# Patient Record
Sex: Female | Born: 1985 | Race: White | Hispanic: No | Smoking: Never smoker
Health system: Southern US, Community
[De-identification: ages and names within clinical notes are randomized; demographics above are authoritative.]

---

## 2018-08-25 DIAGNOSIS — E559 Vitamin D deficiency, unspecified: Secondary | ICD-10-CM | POA: Insufficient documentation

## 2018-09-15 ENCOUNTER — Ambulatory Visit: Payer: Self-pay | Admitting: Family Medicine

## 2019-11-01 DIAGNOSIS — R05 Cough: Secondary | ICD-10-CM | POA: Diagnosis not present

## 2019-11-01 DIAGNOSIS — Z20822 Contact with and (suspected) exposure to covid-19: Secondary | ICD-10-CM | POA: Diagnosis not present

## 2019-11-01 DIAGNOSIS — R0981 Nasal congestion: Secondary | ICD-10-CM | POA: Diagnosis not present

## 2019-11-15 ENCOUNTER — Telehealth: Payer: Self-pay | Admitting: Family

## 2019-11-15 DIAGNOSIS — R399 Unspecified symptoms and signs involving the genitourinary system: Secondary | ICD-10-CM

## 2019-11-15 MED ORDER — CEPHALEXIN 500 MG PO CAPS: 500.0000 mg | ORAL_CAPSULE | Freq: Two times a day (BID) | ORAL | 0 refills | Status: DC

## 2019-11-15 NOTE — Progress Notes (Signed)

## 2020-02-22 ENCOUNTER — Ambulatory Visit
Admission: EM | Admit: 2020-02-22 | Discharge: 2020-02-22 | Disposition: A | Discharge status: Home / Self Care | Payer: Self-pay | Attending: Emergency Medicine | Admitting: Emergency Medicine

## 2020-02-22 ENCOUNTER — Encounter: Payer: Self-pay | Admitting: Emergency Medicine

## 2020-02-22 DIAGNOSIS — J111 Influenza due to unidentified influenza virus with other respiratory manifestations: Secondary | ICD-10-CM

## 2020-02-22 MED ORDER — ACETAMINOPHEN 325 MG PO TABS
650.0000 mg | ORAL_TABLET | Freq: Once | ORAL | Status: AC
  Administered 2020-02-22: 650 mg via ORAL

## 2020-02-22 MED ORDER — IBUPROFEN 800 MG PO TABS: 800.0000 mg | ORAL_TABLET | Freq: Three times a day (TID) | ORAL | 0 refills | Status: DC

## 2020-02-22 NOTE — ED Triage Notes (Signed)
Cough, chills, body aches since last night. Neg at home covid test.

## 2020-02-22 NOTE — Discharge Instructions (Addendum)
Covid/flu test pending, monitor my chart for results Alternate Tylenol and ibuprofen every 4 hours for fevers body aches headaches Rest and fluids Over-the-counter cough and cold medicine to further relieve cough and congestion if needed Follow-up for any concerns

## 2020-02-22 NOTE — ED Provider Notes (Signed)
[facility]    CSN: [id number] Arrival date & time: 02/22/20  0943      History   Chief Complaint Chief Complaint  Patient presents with  . Cough    HPI Brianna Hobbs is a 35 y.o. female presenting today for evaluation of URI symptoms.  Reports cough chills and body aches since last night.  Did at home Covid test which was negative.  Mild associated sore throat or congestion.  Denies known Covid exposures.  HPI  History reviewed. No pertinent past medical history.  There are no problems to display for this patient.   History reviewed. No pertinent surgical history.  OB History   No obstetric history on file.      Home Medications    Prior to Admission medications   Medication Sig Start Date End Date Taking? Authorizing Provider  ibuprofen (ADVIL) 800 MG tablet Take 1 tablet (800 mg total) by mouth 3 (three) times daily. 02/22/20  Yes Naila Elizondo, Junius Creamer, PA-C    Family History Family History  Problem Relation Age of Onset  . Healthy Mother   . Healthy Father     Social History Social History   Tobacco Use  . Smoking status: Never Smoker  . Smokeless tobacco: Never Used  Substance Use Topics  . Alcohol use: Yes     Allergies   Bactrim [sulfamethoxazole-trimethoprim]   Review of Systems Review of Systems  Constitutional: Positive for fever. Negative for activity change, appetite change, chills and fatigue.  HENT: Positive for congestion, rhinorrhea, sinus pressure and sore throat. Negative for ear pain and trouble swallowing.   Eyes: Negative for discharge and redness.  Respiratory: Positive for cough. Negative for chest tightness and shortness of breath.   Cardiovascular: Negative for chest pain.  Gastrointestinal: Negative for abdominal pain, diarrhea, nausea and vomiting.  Musculoskeletal: Negative for myalgias.  Skin: Negative for rash.  Neurological: Negative for dizziness, light-headedness and headaches.     Physical  Exam Triage Vital Signs ED Triage Vitals  Enc Vitals Group     BP 02/22/20 1222 116/79     Pulse Rate 02/22/20 1222 97     Resp 02/22/20 1222 20     Temp 02/22/20 1222 (!) 101.9 F (38.8 C)     Temp Source 02/22/20 1222 Oral     SpO2 02/22/20 1222 97 %     Weight --      Height --      Head Circumference --      Peak Flow --      Pain Score 02/22/20 1220 5     Pain Loc --      Pain Edu? --      Excl. in GC? --    No data found.  Updated Vital Signs BP 116/79 (BP Location: Left Arm)   Pulse 97   Temp (!) 101.9 F (38.8 C) (Oral)   Resp 20   LMP 02/17/2020   SpO2 97%   Visual Acuity Right Eye Distance:   Left Eye Distance:   Bilateral Distance:    Right Eye Near:   Left Eye Near:    Bilateral Near:     Physical Exam Vitals and nursing note reviewed.  Constitutional:      Appearance: She is well-developed and well-nourished.     Comments: No acute distress  HENT:     Head: Normocephalic and atraumatic.     Ears:     Comments: Bilateral ears without tenderness to palpation of external auricle,  tragus and mastoid, EAC's without erythema or swelling, TM's with good bony landmarks and cone of light. Non erythematous.     Nose: Nose normal.     Mouth/Throat:     Comments: Oral mucosa pink and moist, no tonsillar enlargement or exudate. Posterior pharynx patent and nonerythematous, no uvula deviation or swelling. Normal phonation. Eyes:     Conjunctiva/sclera: Conjunctivae normal.  Cardiovascular:     Rate and Rhythm: Normal rate.  Pulmonary:     Effort: Pulmonary effort is normal. No respiratory distress.     Comments: Breathing comfortably at rest, CTABL, no wheezing, rales or other adventitious sounds auscultated Abdominal:     General: There is no distension.  Musculoskeletal:        General: Normal range of motion.     Cervical back: Neck supple.  Skin:    General: Skin is warm and dry.  Neurological:     Mental Status: She is alert and oriented to  person, place, and time.  Psychiatric:        Mood and Affect: Mood and affect normal.      UC Treatments / Results  Labs (all labs ordered are listed, but only abnormal results are displayed) Labs Reviewed  COVID-19, FLU A+B NAA    EKG   Radiology No results found.  Procedures Procedures (including critical care time)  Medications Ordered in UC Medications  acetaminophen (TYLENOL) tablet 650 mg (650 mg Oral Given 02/22/20 1239)    Initial Impression / Assessment and Plan / UC Course  I have reviewed the triage vital signs and the nursing notes.  Pertinent labs & imaging results that were available during my care of the patient were reviewed by me and considered in my medical decision making (see chart for details).     Covid/flu test pending-suspect viral etiology, exam reassuring.  Symptomatic and supportive care rest and fluids.  Discussed strict return precautions. Patient verbalized understanding and is agreeable with plan.  Final Clinical Impressions(s) / UC Diagnoses   Final diagnoses:  Influenza-like illness     Discharge Instructions     Covid/flu test pending, monitor my chart for results Alternate Tylenol and ibuprofen every 4 hours for fevers body aches headaches Rest and fluids Over-the-counter cough and cold medicine to further relieve cough and congestion if needed Follow-up for any concerns    ED Prescriptions    Medication Sig Dispense Auth. Provider   ibuprofen (ADVIL) 800 MG tablet Take 1 tablet (800 mg total) by mouth 3 (three) times daily. 21 tablet Eun Vermeer, [place] C, PA-C     PDMP not reviewed this encounter.   Lew Dawes, PA-C 02/22/20 1259

## 2020-02-22 NOTE — ED Notes (Signed)
No answer, message left

## 2020-02-23 ENCOUNTER — Ambulatory Visit: Payer: Self-pay

## 2020-02-24 LAB — COVID-19, FLU A+B NAA
Influenza A, NAA: NOT DETECTED
Influenza B, NAA: NOT DETECTED
SARS-CoV-2, NAA: DETECTED — AB

## 2020-04-24 ENCOUNTER — Telehealth (INDEPENDENT_AMBULATORY_CARE_PROVIDER_SITE_OTHER): Payer: BC Managed Care – PPO | Admitting: Primary Care

## 2020-04-24 DIAGNOSIS — U071 COVID-19: Secondary | ICD-10-CM

## 2020-04-24 DIAGNOSIS — Z7689 Persons encountering health services in other specified circumstances: Secondary | ICD-10-CM | POA: Diagnosis not present

## 2020-04-24 NOTE — Progress Notes (Signed)
Virtual Visit via Telephone Note  I connected with Brianna Hobbs on 04/24/20 at  4:10 PM EST by telephone and verified that I am speaking with the correct person using two identifiers.  Location: Patient: car  Provider: Kerin Perna @[facility]     I discussed the limitations, risks, security and privacy concerns of performing an evaluation and management service by telephone and the availability of in person appointments. I also discussed with the patient that there may be a patient responsible charge related to this service. The patient expressed understanding and agreed to proceed.   History of Present Illness: Ms. Brianna Hobbs is a 35 year old female was dx COVID 02/22/20 and was schedule for a f/u . She was asymptomatic and doing well. She does have chest pain on her left breast goes away with in minutes.  Headaches when a strong smell is near. Father has HTN. Establishing care.   Observations/Objective: There were no vitals taken for this visit. Pertinent positive and negative noted in HPI.  Assessment and Plan: Diagnoses and all orders for this visit:  Encounter to establish care Establish care with new Provider. Asked to schedule in person appt for Bp check and labs   [facility] ED f/u resolved no s/s with infection     Follow Up Instructions:    I discussed the assessment and treatment plan with the patient. The patient was provided an opportunity to ask questions and all were answered. The patient agreed with the plan and demonstrated an understanding of the instructions.   The patient was advised to call back or seek an in-person evaluation if the symptoms worsen or if the condition fails to improve as anticipated.  I provided 10  minutes of non-face-to-face time during this encounter.   Kerin Perna, NP

## 2020-05-03 ENCOUNTER — Ambulatory Visit (INDEPENDENT_AMBULATORY_CARE_PROVIDER_SITE_OTHER): Payer: BC Managed Care – PPO | Admitting: Primary Care

## 2020-05-04 ENCOUNTER — Ambulatory Visit (INDEPENDENT_AMBULATORY_CARE_PROVIDER_SITE_OTHER): Payer: BC Managed Care – PPO | Admitting: Primary Care

## 2020-05-04 ENCOUNTER — Other Ambulatory Visit: Payer: Self-pay

## 2020-05-04 ENCOUNTER — Encounter (INDEPENDENT_AMBULATORY_CARE_PROVIDER_SITE_OTHER): Payer: Self-pay | Admitting: Primary Care

## 2020-05-04 VITALS — BP 114/77 | HR 75 | Temp 97.5°F | Ht 62.0 in | Wt 126.6 lb

## 2020-05-04 DIAGNOSIS — R5383 Other fatigue: Secondary | ICD-10-CM

## 2020-05-04 DIAGNOSIS — N926 Irregular menstruation, unspecified: Secondary | ICD-10-CM | POA: Diagnosis not present

## 2020-05-04 DIAGNOSIS — R7989 Other specified abnormal findings of blood chemistry: Secondary | ICD-10-CM | POA: Diagnosis not present

## 2020-05-04 NOTE — Progress Notes (Signed)
Established Patient Office Visit  Subjective:  Patient ID: Brianna Hobbs, female    DOB: April 01, 1985  Age: 35 y.o. MRN: [id number]  CC:  Chief Complaint  Patient presents with  . Blood Pressure Check    HPI Ms. Brianna Hobbs is a 35 year old female who presents for annual visit . History reviewed. No pertinent past medical history.  History reviewed. No pertinent surgical history.  Family History  Problem Relation Age of Onset  . Healthy Mother   . Healthy Father     Social History   Socioeconomic History  . Marital status: Divorced    Spouse name: Not on file  . Number of children: Not on file  . Years of education: Not on file  . Highest education level: Not on file  Occupational History  . Not on file  Tobacco Use  . Smoking status: Never Smoker  . Smokeless tobacco: Never Used  Substance and Sexual Activity  . Alcohol use: Yes  . Drug use: Not on file  . Sexual activity: Not on file  Other Topics Concern  . Not on file  Social History Narrative  . Not on file   Social Determinants of Health   Financial Resource Strain: Not on file  Food Insecurity: Not on file  Transportation Needs: Not on file  Physical Activity: Not on file  Stress: Not on file  Social Connections: Not on file  Intimate Partner Violence: Not on file    No outpatient medications prior to visit.   No facility-administered medications prior to visit.    Allergies  Allergen Reactions  . Bactrim [Sulfamethoxazole-Trimethoprim]     ROS Review of Systems  Constitutional: Positive for fatigue.  All other systems reviewed and are negative.     Objective:    Physical Exam Vitals reviewed.  Constitutional:      Appearance: Normal appearance. She is normal weight.  HENT:     Head: Normocephalic.     Right Ear: Tympanic membrane and external ear normal.     Left Ear: Tympanic membrane and external ear normal.     Nose: Nose normal.  Eyes:     Extraocular  Movements: Extraocular movements intact.     Pupils: Pupils are equal, round, and reactive to light.  Cardiovascular:     Rate and Rhythm: Normal rate and regular rhythm.  Pulmonary:     Effort: Pulmonary effort is normal.     Breath sounds: Normal breath sounds.  Abdominal:     General: Abdomen is flat. Bowel sounds are normal.     Palpations: Abdomen is soft.  Musculoskeletal:        General: Normal range of motion.     Cervical back: Normal range of motion and neck supple.  Skin:    General: Skin is warm and dry.  Neurological:     Mental Status: She is alert and oriented to person, place, and time.  Psychiatric:        Mood and Affect: Mood normal.        Behavior: Behavior normal.        Thought Content: Thought content normal.        Judgment: Judgment normal.     BP 114/77 (BP Location: Right Arm, Patient Position: Sitting, Cuff Size: Normal)   Pulse 75   Temp (!) 97.5 F (36.4 C) (Temporal)   Ht '5\' 2"'  (1.575 m)   Wt 126 lb 9.6 oz (57.4 kg)   LMP 04/17/2020 (Exact Date)  SpO2 98%   BMI 23.16 kg/m  Wt Readings from Last 3 Encounters:  05/04/20 126 lb 9.6 oz (57.4 kg)     Health Maintenance Due  Topic Date Due  . HIV Screening  Never done  . PAP SMEAR-Modifier  Never done    There are no preventive care reminders to display for this patient.  No results found for: TSH No results found for: WBC, HGB, HCT, MCV, PLT No results found for: NA, K, CHLORIDE, CO2, GLUCOSE, BUN, CREATININE, BILITOT, ALKPHOS, AST, ALT, PROT, ALBUMIN, CALCIUM, ANIONGAP, EGFR, GFR No results found for: CHOL No results found for: HDL No results found for: LDLCALC No results found for: TRIG No results found for: CHOLHDL No results found for: HGBA1C    Assessment & Plan:  [name] was seen today for blood pressure check.  Diagnoses and all orders for this visit:  Low vitamin D level Hx of which can also cause fatigue. -     Vitamin D, 25-hydroxy  Fatigue, unspecified  type Rule out causative factors  -     CBC with Differential -     TSH + free T4 -     Vitamin D, 25-hydroxy    Follow-up: Return if symptoms worsen or fail to improve, for chedule Pap with GYN.    Kerin Perna, NP

## 2020-05-04 NOTE — Patient Instructions (Signed)
Health Maintenance, Female Adopting a healthy lifestyle and getting preventive care are important in promoting health and wellness. Ask your health care provider about:  The right schedule for you to have regular tests and exams.  Things you can do on your own to prevent diseases and keep yourself healthy. What should I know about diet, weight, and exercise? Eat a healthy diet  Eat a diet that includes plenty of vegetables, fruits, low-fat dairy products, and lean protein.  Do not eat a lot of foods that are high in solid fats, added sugars, or sodium.   Maintain a healthy weight Body mass index (BMI) is used to identify weight problems. It estimates body fat based on height and weight. Your health care provider can help determine your BMI and help you achieve or maintain a healthy weight. Get regular exercise Get regular exercise. This is one of the most important things you can do for your health. Most adults should:  Exercise for at least 150 minutes each week. The exercise should increase your heart rate and make you sweat (moderate-intensity exercise).  Do strengthening exercises at least twice a week. This is in addition to the moderate-intensity exercise.  Spend less time sitting. Even light physical activity can be beneficial. Watch cholesterol and blood lipids Have your blood tested for lipids and cholesterol at 33 years of age, then have this test every 5 years. Have your cholesterol levels checked more often if:  Your lipid or cholesterol levels are high.  You are older than 33 years of age.  You are at high risk for heart disease. What should I know about cancer screening? Depending on your health history and family history, you may need to have cancer screening at various ages. This may include screening for:  Breast cancer.  Cervical cancer.  Colorectal cancer.  Skin cancer.  Lung cancer. What should I know about heart disease, diabetes, and high blood  pressure? Blood pressure and heart disease  High blood pressure causes heart disease and increases the risk of stroke. This is more likely to develop in people who have high blood pressure readings, are of African descent, or are overweight.  Have your blood pressure checked: ? Every 3-5 years if you are 18-39 years of age. ? Every year if you are 40 years old or older. Diabetes Have regular diabetes screenings. This checks your fasting blood sugar level. Have the screening done:  Once every three years after age 40 if you are at a normal weight and have a low risk for diabetes.  More often and at a younger age if you are overweight or have a high risk for diabetes. What should I know about preventing infection? Hepatitis B If you have a higher risk for hepatitis B, you should be screened for this virus. Talk with your health care provider to find out if you are at risk for hepatitis B infection. Hepatitis C Testing is recommended for:  Everyone born from 1945 through 1965.  Anyone with known risk factors for hepatitis C. Sexually transmitted infections (STIs)  Get screened for STIs, including gonorrhea and chlamydia, if: ? You are sexually active and are younger than 33 years of age. ? You are older than 33 years of age and your health care provider tells you that you are at risk for this type of infection. ? Your sexual activity has changed since you were last screened, and you are at increased risk for chlamydia or gonorrhea. Ask your health care provider   if you are at risk.  Ask your health care provider about whether you are at high risk for HIV. Your health care provider may recommend a prescription medicine to help prevent HIV infection. If you choose to take medicine to prevent HIV, you should first get tested for HIV. You should then be tested every 3 months for as long as you are taking the medicine. Pregnancy  If you are about to stop having your period (premenopausal) and  you may become pregnant, seek counseling before you get pregnant.  Take 400 to 800 micrograms (mcg) of folic acid every day if you become pregnant.  Ask for birth control (contraception) if you want to prevent pregnancy. Osteoporosis and menopause Osteoporosis is a disease in which the bones lose minerals and strength with aging. This can result in bone fractures. If you are 65 years old or older, or if you are at risk for osteoporosis and fractures, ask your health care provider if you should:  Be screened for bone loss.  Take a calcium or vitamin D supplement to lower your risk of fractures.  Be given hormone replacement therapy (HRT) to treat symptoms of menopause. Follow these instructions at home: Lifestyle  Do not use any products that contain nicotine or tobacco, such as cigarettes, e-cigarettes, and chewing tobacco. If you need help quitting, ask your health care provider.  Do not use street drugs.  Do not share needles.  Ask your health care provider for help if you need support or information about quitting drugs. Alcohol use  Do not drink alcohol if: ? Your health care provider tells you not to drink. ? You are pregnant, may be pregnant, or are planning to become pregnant.  If you drink alcohol: ? Limit how much you use to 0-1 drink a day. ? Limit intake if you are breastfeeding.  Be aware of how much alcohol is in your drink. In the [place] one drink equals one 12 oz bottle of beer (355 mL), one 5 oz glass of wine (148 mL), or one 1 oz glass of hard liquor (44 mL). General instructions  Schedule regular health, dental, and eye exams.  Stay current with your vaccines.  Tell your health care provider if: ? You often feel depressed. ? You have ever been abused or do not feel safe at home. Summary  Adopting a healthy lifestyle and getting preventive care are important in promoting health and wellness.  Follow your health care provider's instructions about healthy  diet, exercising, and getting tested or screened for diseases.  Follow your health care provider's instructions on monitoring your cholesterol and blood pressure. This information is not intended to replace advice given to you by your health care provider. Make sure you discuss any questions you have with your health care provider. Document Revised: 01/28/2018 Document Reviewed: 01/28/2018 Elsevier Patient Education  2021 Elsevier Inc.  

## 2020-05-05 LAB — CMP14+EGFR
ALT: 6 IU/L (ref 0–32)
AST: 16 IU/L (ref 0–40)
Albumin/Globulin Ratio: 2.1 (ref 1.2–2.2)
Albumin: 4.5 g/dL (ref 3.8–4.8)
Alkaline Phosphatase: 67 IU/L (ref 44–121)
BUN/Creatinine Ratio: 16 (ref 9–23)
BUN: 12 mg/dL (ref 6–20)
Bilirubin Total: 0.7 mg/dL (ref 0.0–1.2)
CO2: 24 mmol/L (ref 20–29)
Calcium: 9.5 mg/dL (ref 8.7–10.2)
Chloride: 103 mmol/L (ref 96–106)
Creatinine, Ser: 0.73 mg/dL (ref 0.57–1.00)
Globulin, Total: 2.1 g/dL (ref 1.5–4.5)
Glucose: 80 mg/dL (ref 65–99)
Potassium: 4.4 mmol/L (ref 3.5–5.2)
Sodium: 140 mmol/L (ref 134–144)
Total Protein: 6.6 g/dL (ref 6.0–8.5)
eGFR: 111 mL/min/{1.73_m2} (ref >59)

## 2020-05-05 LAB — CBC WITH DIFFERENTIAL/PLATELET
Basophils Absolute: 0.1 10*3/uL (ref 0.0–0.2)
Basos: 1 %
EOS (ABSOLUTE): 0.1 10*3/uL (ref 0.0–0.4)
Eos: 2 %
Hematocrit: 40.4 % (ref 34.0–46.6)
Hemoglobin: 13.7 g/dL (ref 11.1–15.9)
Immature Grans (Abs): 0 10*3/uL (ref 0.0–0.1)
Immature Granulocytes: 0 %
Lymphocytes Absolute: 1.6 10*3/uL (ref 0.7–3.1)
Lymphs: 22 %
MCH: 29.2 pg (ref 26.6–33.0)
MCHC: 33.9 g/dL (ref 31.5–35.7)
MCV: 86 fL (ref 79–97)
Monocytes Absolute: 0.6 10*3/uL (ref 0.1–0.9)
Monocytes: 8 %
Neutrophils Absolute: 4.8 10*3/uL (ref 1.4–7.0)
Neutrophils: 67 %
Platelets: 262 10*3/uL (ref 150–450)
RBC: 4.69 x10E6/uL (ref 3.77–5.28)
RDW: 12.1 % (ref 11.7–15.4)
WBC: 7.1 10*3/uL (ref 3.4–10.8)

## 2020-05-05 LAB — TSH+FREE T4
Free T4: 1.28 ng/dL (ref 0.82–1.77)
TSH: 1.42 u[IU]/mL (ref 0.450–4.500)

## 2020-05-05 LAB — VITAMIN D 25 HYDROXY (VIT D DEFICIENCY, FRACTURES): Vit D, 25-Hydroxy: 18.1 ng/mL — ABNORMAL LOW (ref 30.0–100.0)

## 2020-05-10 ENCOUNTER — Other Ambulatory Visit (INDEPENDENT_AMBULATORY_CARE_PROVIDER_SITE_OTHER): Payer: Self-pay | Admitting: Primary Care

## 2020-05-10 DIAGNOSIS — E559 Vitamin D deficiency, unspecified: Secondary | ICD-10-CM

## 2020-05-10 MED ORDER — VITAMIN D3 50 MCG (2000 UT) PO CAPS: 2000.0000 [IU] | ORAL_CAPSULE | Freq: Every day | ORAL | 1 refills | Status: AC

## 2020-05-10 MED ORDER — VITAMIN D (ERGOCALCIFEROL) 1.25 MG (50000 UNIT) PO CAPS: 50000.0000 [IU] | ORAL_CAPSULE | ORAL | 0 refills | Status: DC

## 2020-05-30 ENCOUNTER — Ambulatory Visit: Payer: Self-pay

## 2020-06-07 ENCOUNTER — Other Ambulatory Visit (INDEPENDENT_AMBULATORY_CARE_PROVIDER_SITE_OTHER): Payer: Self-pay | Admitting: Primary Care

## 2020-06-07 DIAGNOSIS — E559 Vitamin D deficiency, unspecified: Secondary | ICD-10-CM

## 2020-06-07 NOTE — Telephone Encounter (Signed)
Requested medication (s) are due for refill today: no  Requested medication (s) are on the active medication list: yes   Last refill: 05/10/2020  Future visit scheduled: no  Notes to clinic: 50,000 IU strengths are not delegated    Requested Prescriptions  Pending Prescriptions Disp Refills   Vitamin D, Ergocalciferol, (DRISDOL) 1.25 MG (50000 UNIT) CAPS capsule [Pharmacy Med Name: VITAMIN D2 1.25MG (50,000 UNIT)] 4 capsule 1    Sig: Take 1 capsule (50,000 Units total) by mouth every 7 (seven) days.      Endocrinology:  Vitamins - Vitamin D Supplementation Failed - 06/07/2020  2:30 PM      Failed - 50,000 IU strengths are not delegated      Failed - Phosphate in normal range and within 360 days    No results found for: PHOS        Failed - Vitamin D in normal range and within 360 days    Vit D, 25-Hydroxy  Date Value Ref Range Status  05/04/2020 18.1 (L) 30.0 - 100.0 ng/mL Final    Comment:    Vitamin D deficiency has been defined by the Institute of Medicine and an Endocrine Society practice guideline as a level of serum 25-OH vitamin D less than 20 ng/mL (1,2). The Endocrine Society went on to further define vitamin D insufficiency as a level between 21 and 29 ng/mL (2). 1. IOM (Institute of Medicine). 2010. Dietary reference    intakes for calcium and D. [place]: The    [employer]. 2. Holick MF, Binkley [place], Bischoff-Ferrari HA, et al.    Evaluation, treatment, and prevention of vitamin D    deficiency: an Endocrine Society clinical practice    guideline. JCEM. 2011 Jul; 96(7):1911-30.           Passed - Ca in normal range and within 360 days    Calcium  Date Value Ref Range Status  05/04/2020 9.5 8.7 - 10.2 mg/dL Final          Passed - Valid encounter within last 12 months    Recent Outpatient Visits           1 month ago Low vitamin D level   [place], [place], NP   1 month ago Encounter to establish  care   [place] Kerin Perna, NP

## 2020-08-28 DIAGNOSIS — Z6823 Body mass index (BMI) 23.0-23.9, adult: Secondary | ICD-10-CM | POA: Diagnosis not present

## 2020-08-28 DIAGNOSIS — Z01419 Encounter for gynecological examination (general) (routine) without abnormal findings: Secondary | ICD-10-CM | POA: Diagnosis not present

## 2020-10-03 ENCOUNTER — Encounter (HOSPITAL_COMMUNITY): Payer: Self-pay

## 2020-10-03 ENCOUNTER — Emergency Department (HOSPITAL_COMMUNITY): Payer: BC Managed Care – PPO

## 2020-10-03 ENCOUNTER — Other Ambulatory Visit: Payer: Self-pay

## 2020-10-03 ENCOUNTER — Emergency Department (HOSPITAL_COMMUNITY)
Admission: EM | Admit: 2020-10-03 | Discharge: 2020-10-03 | Disposition: A | Discharge status: Home / Self Care | Payer: BC Managed Care – PPO | Attending: Emergency Medicine | Admitting: Emergency Medicine

## 2020-10-03 DIAGNOSIS — R1032 Left lower quadrant pain: Secondary | ICD-10-CM | POA: Diagnosis not present

## 2020-10-03 DIAGNOSIS — Z20822 Contact with and (suspected) exposure to covid-19: Secondary | ICD-10-CM | POA: Insufficient documentation

## 2020-10-03 DIAGNOSIS — N83202 Unspecified ovarian cyst, left side: Secondary | ICD-10-CM | POA: Insufficient documentation

## 2020-10-03 DIAGNOSIS — K429 Umbilical hernia without obstruction or gangrene: Secondary | ICD-10-CM | POA: Diagnosis not present

## 2020-10-03 DIAGNOSIS — R102 Pelvic and perineal pain: Secondary | ICD-10-CM | POA: Diagnosis not present

## 2020-10-03 DIAGNOSIS — N838 Other noninflammatory disorders of ovary, fallopian tube and broad ligament: Secondary | ICD-10-CM | POA: Diagnosis not present

## 2020-10-03 DIAGNOSIS — R109 Unspecified abdominal pain: Secondary | ICD-10-CM

## 2020-10-03 DIAGNOSIS — Z87442 Personal history of urinary calculi: Secondary | ICD-10-CM | POA: Diagnosis not present

## 2020-10-03 LAB — COMPREHENSIVE METABOLIC PANEL
ALT: 10 U/L (ref 0–44)
AST: 22 U/L (ref 15–41)
Albumin: 4 g/dL (ref 3.5–5.0)
Alkaline Phosphatase: 55 U/L (ref 38–126)
Anion gap: 7 (ref 5–15)
BUN: 11 mg/dL (ref 6–20)
CO2: 26 mmol/L (ref 22–32)
Calcium: 9.1 mg/dL (ref 8.9–10.3)
Chloride: 103 mmol/L (ref 98–111)
Creatinine, Ser: 0.8 mg/dL (ref 0.44–1.00)
GFR, Estimated: >60 mL/min (ref >60)
Glucose, Bld: 112 mg/dL — ABNORMAL HIGH (ref 70–99)
Potassium: 3.6 mmol/L (ref 3.5–5.1)
Sodium: 136 mmol/L (ref 135–145)
Total Bilirubin: 0.9 mg/dL (ref 0.3–1.2)
Total Protein: 6.9 g/dL (ref 6.5–8.1)

## 2020-10-03 LAB — CBC WITH DIFFERENTIAL/PLATELET
Abs Immature Granulocytes: 0.02 10*3/uL (ref 0.00–0.07)
Basophils Absolute: 0 10*3/uL (ref 0.0–0.1)
Basophils Relative: 0 %
Eosinophils Absolute: 0.1 10*3/uL (ref 0.0–0.5)
Eosinophils Relative: 1 %
HCT: 40.2 % (ref 36.0–46.0)
Hemoglobin: 13.5 g/dL (ref 12.0–15.0)
Immature Granulocytes: 0 %
Lymphocytes Relative: 16 %
Lymphs Abs: 0.9 10*3/uL (ref 0.7–4.0)
MCH: 29.2 pg (ref 26.0–34.0)
MCHC: 33.6 g/dL (ref 30.0–36.0)
MCV: 87 fL (ref 80.0–100.0)
Monocytes Absolute: 0.4 10*3/uL (ref 0.1–1.0)
Monocytes Relative: 7 %
Neutro Abs: 4.5 10*3/uL (ref 1.7–7.7)
Neutrophils Relative %: 76 %
Platelets: 249 10*3/uL (ref 150–400)
RBC: 4.62 MIL/uL (ref 3.87–5.11)
RDW: 11.9 % (ref 11.5–15.5)
WBC: 5.9 10*3/uL (ref 4.0–10.5)
nRBC: 0 % (ref 0.0–0.2)

## 2020-10-03 LAB — RESP PANEL BY RT-PCR (FLU A&B, COVID) ARPGX2
Influenza A by PCR: NEGATIVE
Influenza B by PCR: NEGATIVE
SARS Coronavirus 2 by RT PCR: NEGATIVE

## 2020-10-03 LAB — URINALYSIS, ROUTINE W REFLEX MICROSCOPIC
Bilirubin Urine: NEGATIVE
Glucose, UA: NEGATIVE mg/dL
Hgb urine dipstick: NEGATIVE
Ketones, ur: NEGATIVE mg/dL
Leukocytes,Ua: NEGATIVE
Nitrite: NEGATIVE
Protein, ur: NEGATIVE mg/dL
Specific Gravity, Urine: 1.019 (ref 1.005–1.030)
pH: 8 (ref 5.0–8.0)

## 2020-10-03 LAB — LIPASE, BLOOD: Lipase: 30 U/L (ref 11–51)

## 2020-10-03 LAB — I-STAT BETA HCG BLOOD, ED (MC, WL, AP ONLY): I-stat hCG, quantitative: <5 m[IU]/mL (ref <5)

## 2020-10-03 MED ORDER — OXYCODONE-ACETAMINOPHEN 5-325 MG PO TABS: 1.0000 | ORAL_TABLET | Freq: Four times a day (QID) | ORAL | 0 refills | Status: DC | PRN

## 2020-10-03 MED ORDER — OXYCODONE-ACETAMINOPHEN 5-325 MG PO TABS
1.0000 | ORAL_TABLET | ORAL | Status: AC
Start: 2020-10-03 — End: 2020-10-03
  Administered 2020-10-03: 1 via ORAL
  Filled 2020-10-03: qty 1

## 2020-10-03 NOTE — Discharge Instructions (Addendum)
Please follow up with your OBGYN regarding ED visit. It is recommended that you have a repeat pelvic ultrasound performed in 6-12 weeks to evaluate for resolution of the cyst.   You can take 800 mg Ibuprofen every 8 hours as needed for pain. I have prescribed a short course of narcotic pain medication to take for breakthrough/severe pain.   Return to the ED for any new/worsening symptoms

## 2020-10-03 NOTE — ED Provider Notes (Signed)
Updated by radiology that patient CT scan is concerning for torsion with some asymmetric enlargement.   Brianna Hobbs, [place] 10/03/20 1121    [name], MD 10/03/20 1439

## 2020-10-03 NOTE — ED Provider Notes (Signed)
[facility] EMERGENCY DEPARTMENT Provider Note   CSN: [id number] Arrival date & time: 10/03/20  0856     History Chief Complaint  Patient presents with   Flank Pain   Nausea    Brianna Hobbs is a 35 y.o. female who presents to the ED today with complaint of gradual onset, constant, achy, left flank pain that began earlier today with nausea.  Reports history of kidney stones and felt that this pain was similar.  She took ibuprofen without relief prompting ED visit today.  Denies any vomiting with same.  No fevers or chills.  Last normal menstrual cycle July 24.  She denies any vaginal discharge or pelvic pain specifically. No other complaints at this time.  The history is provided by the patient and medical records.      History reviewed. No pertinent past medical history.  There are no problems to display for this patient.   History reviewed. No pertinent surgical history.   OB History   No obstetric history on file.     Family History  Problem Relation Age of Onset   Healthy Mother    Healthy Father     Social History   Tobacco Use   Smoking status: Never   Smokeless tobacco: Never  Substance Use Topics   Alcohol use: Yes    Home Medications Prior to Admission medications   Medication Sig Start Date End Date Taking? Authorizing Provider  Cholecalciferol (VITAMIN D3) 50 MCG (2000 UT) capsule Take 1 capsule (2,000 Units total) by mouth daily. 05/10/20  Yes Kerin Perna, NP  folic acid (FOLVITE) 1 MG tablet Take 1 mg by mouth daily.   Yes [provider]  Multiple Vitamin (MULTIVITAMIN WITH MINERALS) TABS tablet Take 1 tablet by mouth daily.   Yes [provider]  oxyCODONE-acetaminophen (PERCOCET/ROXICET) 5-325 MG tablet Take 1 tablet by mouth every 6 (six) hours as needed for severe pain. 10/03/20  Yes Alroy Bailiff, Jaidan Stachnik, PA-C    Allergies    Bactrim [sulfamethoxazole-trimethoprim]  Review of Systems   Review of  Systems  Constitutional:  Negative for chills and fever.  Gastrointestinal:  Positive for nausea. Negative for abdominal pain and vomiting.  Genitourinary:  Positive for flank pain. Negative for difficulty urinating, dysuria, frequency, hematuria, pelvic pain, vaginal bleeding and vaginal discharge.  All other systems reviewed and are negative.  Physical Exam Updated Vital Signs BP 116/73   Pulse 78   Temp 98.5 F (36.9 C) (Oral)   Resp 12   Ht '5\' 2"'$  (1.575 m)   Wt 56.7 kg   LMP 09/11/2020   SpO2 100%   BMI 22.86 kg/m   Physical Exam Vitals and nursing note reviewed.  Constitutional:      Appearance: She is not ill-appearing or diaphoretic.  HENT:     Head: Normocephalic and atraumatic.  Eyes:     Conjunctiva/sclera: Conjunctivae normal.  Cardiovascular:     Rate and Rhythm: Normal rate and regular rhythm.     Pulses: Normal pulses.  Pulmonary:     Effort: Pulmonary effort is normal.     Breath sounds: Normal breath sounds. No wheezing, rhonchi or rales.  Abdominal:     Palpations: Abdomen is soft.     Tenderness: There is abdominal tenderness. There is no right CVA tenderness, left CVA tenderness, guarding or rebound.     Comments: Soft, very mild left sided abdominal TTP, +BS throughout, no r/g/r, neg murphy's, neg mcburney's, no CVA TTP  Musculoskeletal:  Cervical back: Neck supple.  Skin:    General: Skin is warm and dry.  Neurological:     Mental Status: She is alert.    ED Results / Procedures / Treatments   Labs (all labs ordered are listed, but only abnormal results are displayed) Labs Reviewed  URINALYSIS, ROUTINE W REFLEX MICROSCOPIC - Abnormal; Notable for the following components:      Result Value   APPearance CLOUDY (*)    All other components within normal limits  COMPREHENSIVE METABOLIC PANEL - Abnormal; Notable for the following components:   Glucose, Bld 112 (*)    All other components within normal limits  RESP PANEL BY RT-PCR (FLU A&B,  COVID) [id number]  CBC WITH DIFFERENTIAL/PLATELET  LIPASE, BLOOD  I-STAT BETA HCG BLOOD, ED ([facility], [facility], [facility] ONLY)    EKG None  Radiology No results found.  Procedures Procedures   Medications Ordered in ED Medications  oxyCODONE-acetaminophen (PERCOCET/ROXICET) 5-325 MG per tablet 1 tablet (1 tablet Oral Given 10/03/20 [id number])    ED Course  I have reviewed the triage vital signs and the nursing notes.  Pertinent labs & imaging results that were available during my care of the patient were reviewed by me and considered in my medical decision making (see chart for details).    MDM Rules/Calculators/A&P                           35 year old female who presents to the ED today with complaint of left flank pain and nausea with history of kidney stones.  She was medically screened in the waiting room and a CT renal stone study as well as labs were ordered.  CT renal stone study did not show any evidence of nephroureterolithiasis or hydronephrosis, it did however show an asymmetrically enlarged left ovary.  A pelvic ultrasound was recommended to assess for any underlying lesion or ovarian torsion.  Pelvic ultrasound was ordered while in the waiting room.  Brought back to room pending pelvic ultrasound, has not resulted.  She appears comfortable on exam.  She states her pain is improved after the Percocet.  She has some mild left-sided abdominal tenderness palpation without rebound or guarding.  No lower abdominal tenderness palpation.  Plan to await ultrasound at this time.  Patient denies any specific pelvic pain, vaginal discharge.  Last normal menstrual period on July 24.  She is not concerned regarding STIs.  Ultrasound:  No evidence of ovarian torsion 2.9 cm complex cystic lesion within the left ovary is indeterminate in appearance although likely represents a hemorrhagic cyst or corpus luteum. Follow up pelvic ultrasound in 6-12 weeks is recommended to evaluate for resolution.   Question if  this left ovarian cyst is causing patient's pain as the remainder of her CT renal stone study was without acute findings.  Lab work unremarkable at this time and urine without signs of infection or hemoglobin.  We will plan to have patient follow-up with her OB/GYN regarding ED visit today.  Have recommended ibuprofen 800 mg as needed for pain.  Will discharge with short course of narcotic to take as needed. Patient in agreement with plan and stable for discharge.   This note was prepared using Dragon voice recognition software and may include unintentional dictation errors due to the inherent limitations of voice recognition software.  Final Clinical Impression(s) / ED Diagnoses Final diagnoses:  Left flank pain  Cyst of left ovary    Rx / DC Orders  ED Discharge Orders          Ordered    oxyCODONE-acetaminophen (PERCOCET/ROXICET) 5-325 MG tablet  Every 6 hours PRN        10/03/20 1423             Discharge Instructions      Please follow up with your OBGYN regarding ED visit. It is recommended that you have a repeat pelvic ultrasound performed in 6-12 weeks to evaluate for resolution of the cyst.   You can take 800 mg Ibuprofen every 8 hours as needed for pain. I have prescribed a short course of narcotic pain medication to take for breakthrough/severe pain.   Return to the ED for any new/worsening symptoms       Eustaquio Maize, PA-C 10/03/20 1424    Sherwood Gambler, MD 10/04/20 [phone]

## 2020-10-03 NOTE — ED Provider Notes (Signed)
Emergency Medicine Provider Triage Evaluation Note  Rylinn Hishmeh , a 35 y.o. female  was evaluated in triage.  Pt complains of L flank/left abd pain since this AM. Hx of two KS in the past last was two years ago. No vaginal or urinary symptoms.  Review of Systems  Positive: Flank pain Negative: Vom, fever  Physical Exam  BP 108/78   Pulse 74   Temp (!) 97.4 F (36.3 C) (Oral)   Resp 18   Ht '5\' 2"'$  (1.575 m)   Wt 56.7 kg   LMP 09/11/2020   SpO2 100%   BMI 22.86 kg/m  Gen:   Awake, no distress   Resp:  Normal effort  MSK:   Moves extremities without difficulty  Other:  Abd non-tender   Medical Decision Making  Medically screening exam initiated at 9:19 AM.  Appropriate orders placed.  Katianna Culverson was informed that the remainder of the evaluation will be completed by another provider, this initial triage assessment does not replace that evaluation, and the importance of remaining in the ED until their evaluation is complete.  CT renal study ordered. Labs and urine.    Pati Gallo [place], [place] 10/03/20 0930    [name], MD 10/03/20 1439

## 2020-10-03 NOTE — ED Triage Notes (Signed)
Pt reports left sided flank pain with nausea, hx of kidney stones and states this feels similar. Pt took '800mg'$  ibuprofen at 7am this morning.

## 2020-10-04 DIAGNOSIS — R102 Pelvic and perineal pain: Secondary | ICD-10-CM | POA: Diagnosis not present

## 2020-10-04 DIAGNOSIS — R1032 Left lower quadrant pain: Secondary | ICD-10-CM | POA: Diagnosis not present

## 2020-12-23 ENCOUNTER — Other Ambulatory Visit: Payer: Self-pay

## 2020-12-23 ENCOUNTER — Ambulatory Visit
Admission: EM | Admit: 2020-12-23 | Discharge: 2020-12-23 | Disposition: A | Discharge status: Home / Self Care | Payer: Managed Care, Other (non HMO) | Attending: Internal Medicine | Admitting: Internal Medicine

## 2020-12-23 DIAGNOSIS — J069 Acute upper respiratory infection, unspecified: Secondary | ICD-10-CM | POA: Diagnosis not present

## 2020-12-23 MED ORDER — PREDNISONE 20 MG PO TABS: 40.0000 mg | ORAL_TABLET | Freq: Every day | ORAL | 0 refills | Status: AC

## 2020-12-23 MED ORDER — PROMETHAZINE-DM 6.25-15 MG/5ML PO SYRP: 5.0000 mL | ORAL_SOLUTION | Freq: Four times a day (QID) | ORAL | 0 refills | Status: DC | PRN

## 2020-12-23 NOTE — Discharge Instructions (Signed)
You have been sent prednisone steroid to decrease inflammation and a cough medication to take as needed.  Please be advised that cough medication can cause drowsiness.  Follow-up if symptoms persist.

## 2020-12-23 NOTE — ED Triage Notes (Signed)
Pt c/o cough, vomiting, nasal congestion, headache.   Denies sore throat, body aches or chills, ear ache, diarrhea or constipation.   Onset Sunday

## 2020-12-23 NOTE — ED Provider Notes (Signed)
[facility]    CSN: [id number] Arrival date & time: 12/23/20  1302      History   Chief Complaint Chief Complaint  Patient presents with   Cough    HPI Brianna Hobbs is a 35 y.o. female.   Patient presents with cough and nasal congestion that have been present for approximately 6 days.  Cough is dry per patient.  Denies any known fevers or sick contacts.  Denies chest pain, shortness of breath, nausea, vomiting, diarrhea, abdominal pain.  Patient denies any history of asthma or COPD.  Has taken over-the-counter Sudafed with minimal improvement in symptoms.  Patient reports that cough caused an episode of emesis yesterday.   Cough  History reviewed. No pertinent past medical history.  There are no problems to display for this patient.   History reviewed. No pertinent surgical history.  OB History   No obstetric history on file.      Home Medications    Prior to Admission medications   Medication Sig Start Date End Date Taking? Authorizing Provider  predniSONE (DELTASONE) 20 MG tablet Take 2 tablets (40 mg total) by mouth daily for 5 days. 12/23/20 12/28/20 Yes Zekiah Caruth, Michele Rockers, FNP  promethazine-dextromethorphan (PROMETHAZINE-DM) 6.25-15 MG/5ML syrup Take 5 mLs by mouth 4 (four) times daily as needed for cough. 12/23/20  Yes Allexus Ovens, Hildred Alamin E, FNP  Cholecalciferol (VITAMIN D3) 50 MCG (2000 UT) capsule Take 1 capsule (2,000 Units total) by mouth daily. 05/10/20   Kerin Perna, NP  folic acid (FOLVITE) 1 MG tablet Take 1 mg by mouth daily.    [provider]  Multiple Vitamin (MULTIVITAMIN WITH MINERALS) TABS tablet Take 1 tablet by mouth daily.    [provider]  oxyCODONE-acetaminophen (PERCOCET/ROXICET) 5-325 MG tablet Take 1 tablet by mouth every 6 (six) hours as needed for severe pain. 10/03/20   Eustaquio Maize, PA-C    Family History Family History  Problem Relation Age of Onset   Healthy Mother    Healthy Father      Social History Social History   Tobacco Use   Smoking status: Never   Smokeless tobacco: Never  Substance Use Topics   Alcohol use: Yes     Allergies   Bactrim [sulfamethoxazole-trimethoprim]   Review of Systems Review of Systems Per HPI  Physical Exam Triage Vital Signs ED Triage Vitals [12/23/20 1445]  Enc Vitals Group     BP 110/68     Pulse Rate 76     Resp 18     Temp 98.3 F (36.8 C)     Temp Source Oral     SpO2 98 %     Weight      Height      Head Circumference      Peak Flow      Pain Score 0     Pain Loc      Pain Edu?      Excl. in [place]?    No data found.  Updated Vital Signs BP 110/68 (BP Location: Left Arm)   Pulse 76   Temp 98.3 F (36.8 C) (Oral)   Resp 18   SpO2 98%   Visual Acuity Right Eye Distance:   Left Eye Distance:   Bilateral Distance:    Right Eye Near:   Left Eye Near:    Bilateral Near:     Physical Exam Constitutional:      General: She is not in acute distress.    Appearance: Normal appearance.  She is not toxic-appearing or diaphoretic.  HENT:     Head: Normocephalic and atraumatic.     Right Ear: Tympanic membrane and ear canal normal.     Left Ear: Tympanic membrane and ear canal normal.     Nose: Congestion present.     Mouth/Throat:     Mouth: Mucous membranes are moist.     Pharynx: No posterior oropharyngeal erythema.  Eyes:     Extraocular Movements: Extraocular movements intact.     Conjunctiva/sclera: Conjunctivae normal.     Pupils: Pupils are equal, round, and reactive to light.  Cardiovascular:     Rate and Rhythm: Normal rate and regular rhythm.     Pulses: Normal pulses.     Heart sounds: Normal heart sounds.  Pulmonary:     Effort: Pulmonary effort is normal. No respiratory distress.     Breath sounds: Normal breath sounds. No stridor. No wheezing, rhonchi or rales.  Abdominal:     General: Abdomen is flat. Bowel sounds are normal.     Palpations: Abdomen is soft.  Musculoskeletal:         General: Normal range of motion.     Cervical back: Normal range of motion.  Skin:    General: Skin is warm and dry.  Neurological:     General: No focal deficit present.     Mental Status: She is alert and oriented to person, place, and time. Mental status is at baseline.  Psychiatric:        Mood and Affect: Mood normal.        Behavior: Behavior normal.     UC Treatments / Results  Labs (all labs ordered are listed, but only abnormal results are displayed) Labs Reviewed  NOVEL CORONAVIRUS, NAA    EKG   Radiology No results found.  Procedures Procedures (including critical care time)  Medications Ordered in UC Medications - No data to display  Initial Impression / Assessment and Plan / UC Course  I have reviewed the triage vital signs and the nursing notes.  Pertinent labs & imaging results that were available during my care of the patient were reviewed by me and considered in my medical decision making (see chart for details).     Patient presents with symptoms likely from a viral upper respiratory infection. Differential includes bacterial pneumonia, sinusitis, allergic rhinitis, Covid 19. Do not suspect underlying cardiopulmonary process. Symptoms seem unlikely related to ACS, CHF or COPD exacerbations, pneumonia, pneumothorax. Patient is nontoxic appearing and not in need of emergent medical intervention.  COVID 19 PCR pending.  Recommended symptom control with over the counter medications  Prednisone x5 days to help alleviate inflammation in chest.  Promethazine DM to take as needed for cough.  Advised patient cough medication can cause drowsiness.  Will defer chest imaging as lung sounds are clear and patient is not complaining of shortness of breath.  Return if symptoms fail to improve in 1-2 weeks or you develop shortness of breath, chest pain, severe headache. Patient states understanding and is agreeable.  Discharged with PCP followup.  Final Clinical  Impressions(s) / UC Diagnoses   Final diagnoses:  Viral upper respiratory tract infection with cough     Discharge Instructions      You have been sent prednisone steroid to decrease inflammation and a cough medication to take as needed.  Please be advised that cough medication can cause drowsiness.  Follow-up if symptoms persist.     ED Prescriptions  Medication Sig Dispense Auth. Provider   predniSONE (DELTASONE) 20 MG tablet Take 2 tablets (40 mg total) by mouth daily for 5 days. 10 tablet Viola, [place] E, [place]   promethazine-dextromethorphan (PROMETHAZINE-DM) 6.25-15 MG/5ML syrup Take 5 mLs by mouth 4 (four) times daily as needed for cough. 118 mL Teodora Medici, [place]      PDMP not reviewed this encounter.   Teodora Medici, [place] 12/23/20 [phone]

## 2020-12-24 LAB — SARS-COV-2, NAA 2 DAY TAT

## 2020-12-24 LAB — NOVEL CORONAVIRUS, NAA: SARS-CoV-2, NAA: NOT DETECTED

## 2021-01-10 ENCOUNTER — Encounter (HOSPITAL_COMMUNITY): Payer: Self-pay | Admitting: Radiology

## 2021-01-16 ENCOUNTER — Encounter (INDEPENDENT_AMBULATORY_CARE_PROVIDER_SITE_OTHER): Payer: Self-pay | Admitting: Primary Care

## 2021-01-16 ENCOUNTER — Ambulatory Visit (INDEPENDENT_AMBULATORY_CARE_PROVIDER_SITE_OTHER): Payer: Managed Care, Other (non HMO) | Admitting: Primary Care

## 2021-01-16 ENCOUNTER — Other Ambulatory Visit: Payer: Self-pay

## 2021-01-16 VITALS — BP 115/80 | HR 77 | Temp 97.3°F | Ht 62.0 in | Wt 131.8 lb

## 2021-01-16 DIAGNOSIS — R059 Cough, unspecified: Secondary | ICD-10-CM | POA: Diagnosis not present

## 2021-01-16 DIAGNOSIS — D229 Melanocytic nevi, unspecified: Secondary | ICD-10-CM

## 2021-01-16 DIAGNOSIS — J9801 Acute bronchospasm: Secondary | ICD-10-CM | POA: Diagnosis not present

## 2021-01-16 MED ORDER — ALBUTEROL SULFATE HFA 108 (90 BASE) MCG/ACT IN AERS: 2.0000 | INHALATION_SPRAY | Freq: Four times a day (QID) | RESPIRATORY_TRACT | 2 refills | Status: AC | PRN

## 2021-01-16 MED ORDER — PSEUDOEPHEDRINE HCL ER 120 MG PO TB12: 120.0000 mg | ORAL_TABLET | Freq: Two times a day (BID) | ORAL | 1 refills | Status: AC

## 2021-01-16 NOTE — Patient Instructions (Signed)
Explained lack of efficacy of antibiotics in viral disease. Antitussives per medication orders. Avoid exposure to tobacco smoke and fumes. B-agonist inhaler. Call if shortness of breath worsens, blood in sputum, change in character of cough, development of fever or chills, inability to maintain nutrition and hydration. Avoid exposure to tobacco smoke and fumes.

## 2021-01-16 NOTE — Progress Notes (Signed)
Subjective:     Brianna Hobbs is a 35 y.o. female here for evaluation of a cough.  The cough is non-productive, without wheezing, dyspnea or hemoptysis, harsh, barking and is aggravated by dust and fumes. Onset of symptoms was 3 weeks ago, gradually improving since that time.  Associated symptoms include shortness of breath. Patient does not have a history of asthma. Patient has not had recent travel. Patient does not have a history of smoking. Patient  has not had a previous chest x-ray. The following portions of the patient's history were reviewed and updated as appropriate: allergies, current medications, past family history, past medical history, past social history, and past surgical history.  Review of Systems Pertinent items noted in HPI and remainder of comprehensive ROS otherwise negative.     Objective:    Oxygen saturation 100% on room air General appearance: alert, cooperative, and appears stated age Head: Normocephalic, without obvious abnormality, atraumatic Eyes: conjunctivae/corneas clear. PERRL, EOM's intact. Fundi benign. Ears: normal TM's and external ear canals both ears Neck: no adenopathy, no carotid bruit, no JVD, supple, symmetrical, trachea midline, and thyroid not enlarged, symmetric, no tenderness/mass/nodules Lungs: clear to auscultation bilaterally Abdomen: soft, non-tender; bowel sounds normal; no masses,  no organomegaly Extremities: extremities normal, atraumatic, no cyanosis or edema Skin: Skin color, texture, turgor normal. No rashes or lesions    Assessment:  [name] was seen today for cough.  Diagnoses and all orders for this visit:  Bronchospasm, acute -     albuterol (VENTOLIN HFA) 108 (90 Base) MCG/ACT inhaler; Inhale 2 puffs into the lungs every 6 (six) hours as needed for wheezing or shortness of breath.  Numerous skin moles Change in color unclear if size has changed  -     Ambulatory referral to Dermatology  Cough, unspecified  type Secondary to fumes and has lingered since  Other orders -     pseudoephedrine (SUDAFED 12 HOUR) 120 MG 12 hr tablet; Take 1 tablet (120 mg total) by mouth 2 (two) times daily.  Juluis Mire, NP-C

## 2021-02-06 ENCOUNTER — Telehealth: Payer: Self-pay | Admitting: Dermatology

## 2021-02-06 NOTE — Telephone Encounter (Signed)
Referral, appt. 08/06/21 w/ST. She's not sure of name of referrer or practice; thinks it may be [facility]. Please mark as a referral

## 2021-02-06 NOTE — Telephone Encounter (Signed)
Referral attached to appointment

## 2021-08-06 ENCOUNTER — Ambulatory Visit (INDEPENDENT_AMBULATORY_CARE_PROVIDER_SITE_OTHER): Payer: Managed Care, Other (non HMO) | Admitting: Dermatology

## 2021-08-06 ENCOUNTER — Encounter: Payer: Self-pay | Admitting: Dermatology

## 2021-08-06 DIAGNOSIS — Z1283 Encounter for screening for malignant neoplasm of skin: Secondary | ICD-10-CM | POA: Diagnosis not present

## 2021-08-06 DIAGNOSIS — L700 Acne vulgaris: Secondary | ICD-10-CM

## 2021-08-06 MED ORDER — MINOCYCLINE HCL 50 MG PO CAPS
50.0000 mg | ORAL_CAPSULE | Freq: Two times a day (BID) | ORAL | 2 refills | Status: AC
Start: 2021-08-06 — End: ?

## 2021-08-06 NOTE — Patient Instructions (Addendum)
Get OTC Neutrogena rapid relief

## 2021-09-02 ENCOUNTER — Encounter: Payer: Self-pay | Admitting: Dermatology

## 2021-09-02 NOTE — Progress Notes (Signed)
   New Patient   Subjective  Brianna Hobbs is a 36 y.o. female who presents for the following: New Patient (Initial Visit) (Patient here today for skin check. Per patient within the last year her face has started breaking out, per patient she's tried OTC face washes and treatments with no improvement. No personal history or family history of atypical moles, melanoma or non mole skin cancer. ).  Skin check, discuss options for her acne Location:  Duration:  Quality:  Associated Signs/Symptoms: Modifying Factors:  Severity:  Timing: Context:    The following portions of the chart were reviewed this encounter and updated as appropriate:  Tobacco  Allergies  Meds  Problems  Med Hx  Surg Hx  Fam Hx      Objective  Well appearing patient in no apparent distress; mood and affect are within normal limits. Head to toe No atypical nevi or signs of NMSC noted at the time of the visit.  All pigmented lesions checked with dermoscopy.  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  Essentially all treatment options as well as the etiology of her acne in detail.    A full examination was performed including scalp, head, eyes, ears, nose, lips, neck, chest, axillae, abdomen, back, buttocks, bilateral upper extremities, bilateral lower extremities, hands, feet, fingers, toes, fingernails, and toenails. All findings within normal limits unless otherwise noted below.  Areas beneath undergarments not fully examined.   Assessment & Plan  Skin exam for malignant neoplasm Head to toe  Encouraged to self examine twice annually.  Continue ultraviolet protection.  Acne vulgaris Head - Anterior (Face)  Oral minocycline 50 mg twice daily; potential side effects detailed.  She may get over-the-counter Neutrogena rapid clear or stubborn acne topical to use recently.  Follow-up can be by MyChart or phone contact in 6 to 8 weeks.  minocycline (MINOCIN) 50 MG  capsule - Head - Anterior (Face) Take 1 capsule (50 mg total) by mouth 2 (two) times daily.

## 2022-02-22 ENCOUNTER — Encounter (HOSPITAL_COMMUNITY): Payer: Self-pay

## 2022-02-22 ENCOUNTER — Other Ambulatory Visit: Payer: Self-pay

## 2022-02-22 ENCOUNTER — Emergency Department (HOSPITAL_COMMUNITY)
Admission: EM | Admit: 2022-02-22 | Discharge: 2022-02-22 | Disposition: A | Discharge status: Home / Self Care | Payer: BC Managed Care – PPO | Attending: Emergency Medicine | Admitting: Emergency Medicine

## 2022-02-22 ENCOUNTER — Ambulatory Visit
Admit: 2022-02-22 | Discharge status: Absent without leave | Payer: Managed Care, Other (non HMO) | Source: Home / Self Care

## 2022-02-22 DIAGNOSIS — R519 Headache, unspecified: Secondary | ICD-10-CM | POA: Insufficient documentation

## 2022-02-22 DIAGNOSIS — R509 Fever, unspecified: Secondary | ICD-10-CM | POA: Insufficient documentation

## 2022-02-22 DIAGNOSIS — J069 Acute upper respiratory infection, unspecified: Secondary | ICD-10-CM | POA: Diagnosis not present

## 2022-02-22 DIAGNOSIS — Z1152 Encounter for screening for COVID-19: Secondary | ICD-10-CM | POA: Insufficient documentation

## 2022-02-22 DIAGNOSIS — R059 Cough, unspecified: Secondary | ICD-10-CM | POA: Insufficient documentation

## 2022-02-22 DIAGNOSIS — R6889 Other general symptoms and signs: Secondary | ICD-10-CM

## 2022-02-22 LAB — RESP PANEL BY RT-PCR (RSV, FLU A&B, COVID)  RVPGX2
Influenza A by PCR: NEGATIVE
Influenza B by PCR: NEGATIVE
Resp Syncytial Virus by PCR: NEGATIVE
SARS Coronavirus 2 by RT PCR: NEGATIVE

## 2022-02-22 LAB — GROUP A STREP BY PCR: Group A Strep by PCR: NOT DETECTED

## 2022-02-22 NOTE — ED Triage Notes (Signed)
Patient reports that she was exposed to someone with the flu. Patient c/o fever, chills, nasal congestion, and a cough x 4 days.

## 2022-02-22 NOTE — ED Provider Notes (Signed)
[facility] DEPT Provider Note   CSN: [id number] Arrival date & time: 02/22/22  1149     History  Chief Complaint  Patient presents with   Fever   Cough   Chills   Nasal Congestion    Brianna Hobbs is a 37 y.o. female with no significant past medical history presents emergency department with both her children complaining of fever, cough, headache.  Patient states that she was exposed to a family member that tested positive for the flu several days ago.  She started experiencing symptoms originally on 12/28, but they improved, returned 4 days ago.  She denies any chest pain, shortness of breath, vomiting, diarrhea.  History of tonsillectomy many years ago.   Fever Associated symptoms: congestion and cough   Cough Associated symptoms: fever        Home Medications Prior to Admission medications   Medication Sig Start Date End Date Taking? Authorizing Provider  albuterol (VENTOLIN HFA) 108 (90 Base) MCG/ACT inhaler Inhale 2 puffs into the lungs every 6 (six) hours as needed for wheezing or shortness of breath. 01/16/21   Kerin Perna, NP  Cholecalciferol (VITAMIN D3) 50 MCG (2000 UT) capsule Take 1 capsule (2,000 Units total) by mouth daily. 05/10/20   Kerin Perna, NP  folic acid (FOLVITE) 1 MG tablet Take 1 mg by mouth daily.    [provider]  minocycline (MINOCIN) 50 MG capsule Take 1 capsule (50 mg total) by mouth 2 (two) times daily. 08/06/21   Lavonna Monarch, MD  Multiple Vitamin (MULTIVITAMIN WITH MINERALS) TABS tablet Take 1 tablet by mouth daily.    [provider]  pseudoephedrine (SUDAFED 12 HOUR) 120 MG 12 hr tablet Take 1 tablet (120 mg total) by mouth 2 (two) times daily. 01/16/21   Kerin Perna, NP      Allergies    Bactrim [sulfamethoxazole-trimethoprim]    Review of Systems   Review of Systems  Constitutional:  Positive for fever.  HENT:  Positive for congestion.   Respiratory:   Positive for cough.   All other systems reviewed and are negative.   Physical Exam Updated Vital Signs BP 133/89 (BP Location: Left Arm)   Pulse (!) 103   Temp 99.2 F (37.3 C) (Oral)   Resp 16   Ht '5\' 2"'$  (1.575 m)   Wt 59 kg   LMP 02/10/2022 (Exact Date)   SpO2 100%   BMI 23.78 kg/m  Physical Exam Vitals and nursing note reviewed.  Constitutional:      Appearance: Normal appearance.  HENT:     Head: Normocephalic and atraumatic.     Nose: Congestion present.     Mouth/Throat:     Lips: Pink.     Mouth: Mucous membranes are moist.     Pharynx: Oropharynx is clear. Uvula midline.  Eyes:     Conjunctiva/sclera: Conjunctivae normal.  Cardiovascular:     Rate and Rhythm: Normal rate and regular rhythm.  Pulmonary:     Effort: Pulmonary effort is normal. No respiratory distress.     Breath sounds: Normal breath sounds.  Abdominal:     General: There is no distension.     Palpations: Abdomen is soft.     Tenderness: There is no abdominal tenderness.  Skin:    General: Skin is warm and dry.  Neurological:     General: No focal deficit present.     Mental Status: She is alert.     ED Results / Procedures /  Treatments   Labs (all labs ordered are listed, but only abnormal results are displayed) Labs Reviewed  RESP PANEL BY RT-PCR (RSV, FLU A&B, COVID)  [id number]  GROUP A STREP BY PCR    EKG None  Radiology No results found.  Procedures Procedures    Medications Ordered in ED Medications - No data to display  ED Course/ Medical Decision Making/ A&P                           Medical Decision Making  Patient is an otherwise healthy 37 year old female who presents the emergency department with both her children for flulike symptoms for the past 4 days.  Both kids are doing well.  On exam patient is mildly tachycardic, otherwise normal vital signs.  Afebrile.  No increased respiratory effort, lung sounds clear.  HEENT exam normal as above.  Clinically appears  well.  Her children tested positive for strep pharyngitis and influenza B.  Explained to the patient that she herself is without side the range for benefit from Tamiflu.  Low concern for her having strep, as she has previously had her tonsils removed, and has no sore throat.  Will treat patient symptomatically with over-the-counter medications, and provided work note.  Encouraged the whole family to practice good hand hygiene.  Patient discharged in stable condition all questions answered.  Final Clinical Impression(s) / ED Diagnoses Final diagnoses:  Flu-like symptoms  Viral URI with cough    Rx / DC Orders ED Discharge Orders     None      Portions of this report may have been transcribed using voice recognition software. Every effort was made to ensure accuracy; however, inadvertent computerized transcription errors may be present.    [name] 02/22/22 1445    Tegeler, Gwenyth Allegra, MD 02/22/22 [phone]

## 2022-02-22 NOTE — Discharge Instructions (Addendum)
You were seen in the emergency department today for flulike symptoms.  Your strep and viral test were negative.  However both your sons' were positive so I suspect that you likely also have the flu.  You are outside of the window for the Tamiflu medication.  I normally recommend taking over-the-counter medications like ibuprofen, Tylenol, decongestants.  Continue to monitor how you're doing and return to the ER for new or worsening symptoms.

## 2022-02-22 NOTE — ED Provider Triage Note (Signed)
Emergency Medicine Provider Triage Evaluation Note  Michaele Amundson , a 37 y.o. female  was evaluated in triage.  Pt complains of fever, cough, headache.  Patient reports that she was exposed to family emergency tested positive for influenza several days ago began experiencing symptoms approximately 4 days ago.  Denies chest pain, shortness of breath, vomiting, diarrhea..  Review of Systems  Positive: As above Negative: As above  Physical Exam  BP 133/89 (BP Location: Left Arm)   Pulse (!) 103   Temp 99.2 F (37.3 C) (Oral)   Resp 16   Ht '5\' 2"'$  (1.575 m)   Wt 59 kg   LMP 02/10/2022 (Exact Date)   SpO2 100%   BMI 23.78 kg/m  Gen:   Awake, no distress   Resp:  Normal effort clear to auscultation bilaterally MSK:   Moves extremities without difficulty  Other:    Medical Decision Making  Medically screening exam initiated at 12:39 PM.  Appropriate orders placed.  Irelyn Perfecto was informed that the remainder of the evaluation will be completed by another provider, this initial triage assessment does not replace that evaluation, and the importance of remaining in the ED until their evaluation is complete.     Luvenia Heller, PA-C 02/22/22 1240

## 2022-03-26 ENCOUNTER — Encounter (INDEPENDENT_AMBULATORY_CARE_PROVIDER_SITE_OTHER): Payer: Self-pay | Admitting: Primary Care

## 2022-03-26 ENCOUNTER — Ambulatory Visit (INDEPENDENT_AMBULATORY_CARE_PROVIDER_SITE_OTHER): Payer: BC Managed Care – PPO | Admitting: Primary Care

## 2022-03-26 NOTE — Progress Notes (Signed)
[place], is a 37 y.o. female  OIN:[id number]  BSJ:[id number]  DOB - [birth year]  Requested testing for hemochromatosis      Subjective:   Brianna Hobbs is a 37 y.o. female here today as a follow-up visit. Patient is requesting to be tested for hemochromatosis. Her mother was recently diagnosed with hereditary hemochromatosis late Dec 2023. She denies lethargy/weakness, joint pain/stiffness, or RUQ pain. Additionally, patient has No headache, No chest pain, No abdominal pain - No Nausea, No new weakness tingling or numbness, No Cough - shortness of breath  No problems updated.  Allergies  Allergen Reactions   Bactrim [Sulfamethoxazole-Trimethoprim] Hives    No past medical history on file.  Current Outpatient Medications on File Prior to Visit  Medication Sig Dispense Refill   albuterol (VENTOLIN HFA) 108 (90 Base) MCG/ACT inhaler Inhale 2 puffs into the lungs every 6 (six) hours as needed for wheezing or shortness of breath. (Patient not taking: Reported on 03/26/2022) 8 g 2   Cholecalciferol (VITAMIN D3) 50 MCG (2000 UT) capsule Take 1 capsule (2,000 Units total) by mouth daily. (Patient not taking: Reported on 03/26/2022) 90 capsule 1   folic acid (FOLVITE) 1 MG tablet Take 1 mg by mouth daily. (Patient not taking: Reported on 03/26/2022)     minocycline (MINOCIN) 50 MG capsule Take 1 capsule (50 mg total) by mouth 2 (two) times daily. (Patient not taking: Reported on 03/26/2022) 60 capsule 2   Multiple Vitamin (MULTIVITAMIN WITH MINERALS) TABS tablet Take 1 tablet by mouth daily. (Patient not taking: Reported on 03/26/2022)     pseudoephedrine (SUDAFED 12 HOUR) 120 MG 12 hr tablet Take 1 tablet (120 mg total) by mouth 2 (two) times daily. (Patient not taking: Reported on 03/26/2022) 60 tablet 1   No current facility-administered medications on file prior to visit.    Objective:   Vitals:   03/26/22 1627  BP: 117/77  Pulse: 65  Resp: 16   SpO2: 99%  Weight: 128 lb 3.2 oz (58.2 kg)  Height: '5\' 2"'$  (1.575 m)    Comprehensive ROS Pertinent positive and negative noted in HPI   Exam General appearance : Awake, alert, not in any distress. Speech Clear. Not toxic looking HEENT: Atraumatic and Normocephalic, pupils equally reactive to light and accomodation Neck: Supple, no JVD. No cervical lymphadenopathy.  Chest: Good air entry bilaterally, no added sounds  CVS: S1 S2 regular, no murmurs.  Abdomen: Bowel sounds present, Non tender and not distended with no gaurding, rigidity or rebound. Extremities: B/L Lower Ext shows no edema, both legs are warm to touch Neurology: Awake alert, and oriented X 3, CN II-XII intact, Non focal Skin: No Rash  Data Review No results found for: "HGBA1C"  Assessment & Plan   1. Hemochromatosis, unspecified hemochromatosis type - CBC with Differential - Transferrin Saturation - Hepatic function panel - Iron, TIBC and Ferritin Panel - referral to Heme/Onc for further testing - patient to follow-up after Heme/Onc appointment  Patient have been counseled extensively about nutrition and exercise. Other issues discussed during this visit include: low cholesterol diet, weight control and daily exercise, foot care, annual eye examinations at Ophthalmology, importance of adherence with medications and regular follow-up. We also discussed long term complications of uncontrolled diabetes and hypertension.    The patient was given clear instructions to go to ER or return to medical center if symptoms don't improve, worsen or new problems develop. The patient verbalized understanding. The patient was told to call to  get lab results if they haven't heard anything in the next week.   This note has been created with Surveyor, quantity. Any transcriptional errors are unintentional.   Brianna Perna, NP 03/26/2022, 4:39 PM

## 2022-03-28 ENCOUNTER — Telehealth: Payer: Self-pay | Admitting: Oncology

## 2022-03-28 IMAGING — CT CT RENAL STONE PROTOCOL
2 of 4 series · 17 of 46 positions shown, 19 images · non-contrast
Comparison: None.

CLINICAL DATA: Flank pain, history of kidney stones

EXAM:
CT ABDOMEN AND PELVIS WITHOUT CONTRAST
TECHNIQUE: Multidetector CT imaging of the abdomen and pelvis was performed
following the standard protocol without IV contrast.

[Series 3: renal stone 5.0 · axial · 0.88mm/px · z∈[+763,+1113]mm · 14 of 78 slices shown, 16 images]
[im 4/78  soft-tissue]
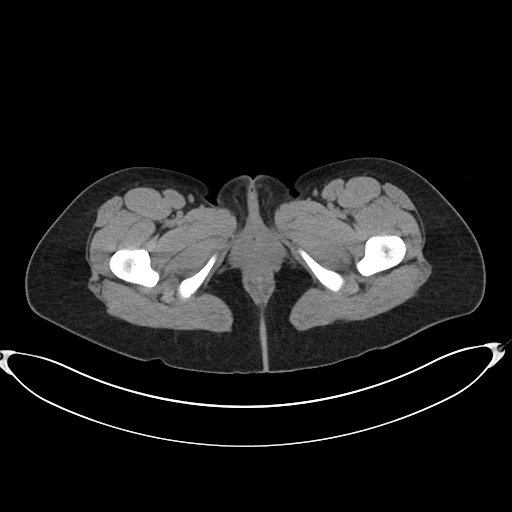
[im 4/78  bone]
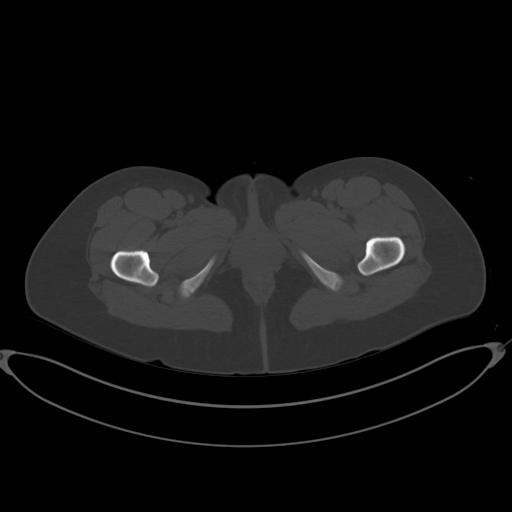
[im 10/78  soft-tissue]
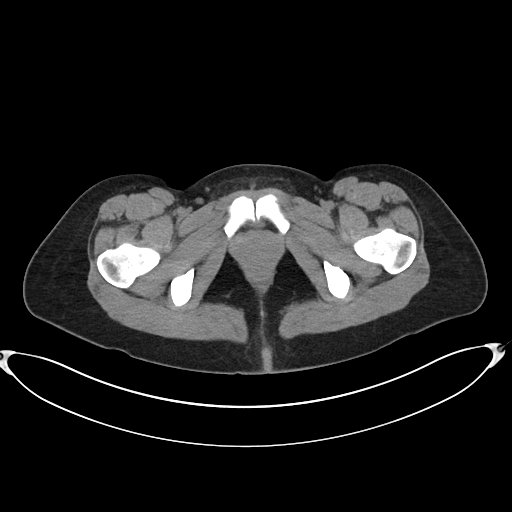
[im 16/78  soft-tissue]
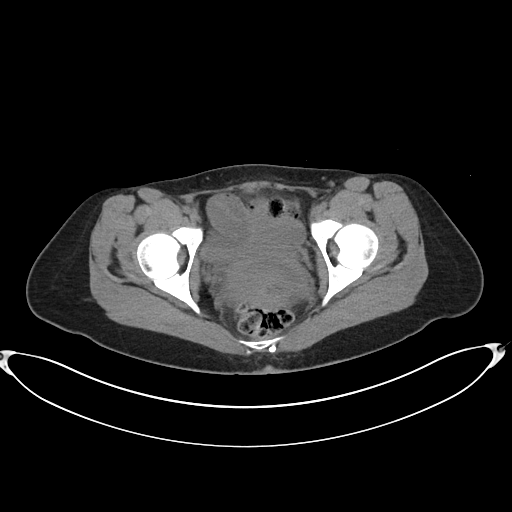
[im 22/78  soft-tissue]
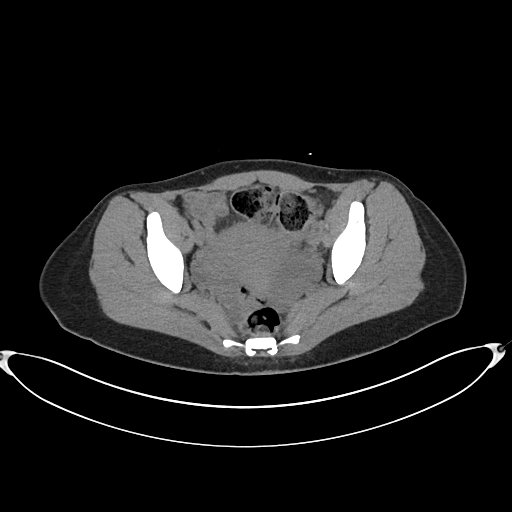
[im 25/78  soft-tissue]
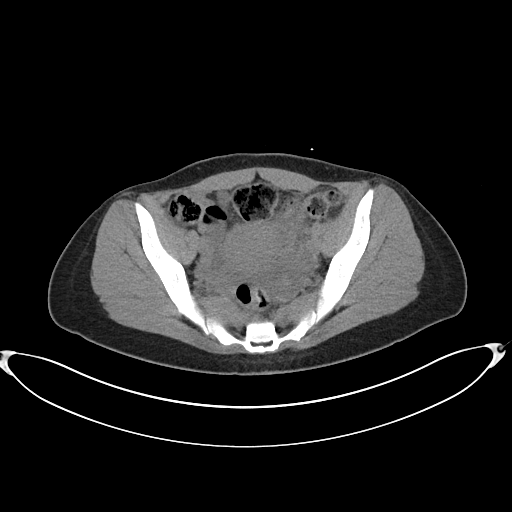
[im 31/78  soft-tissue]
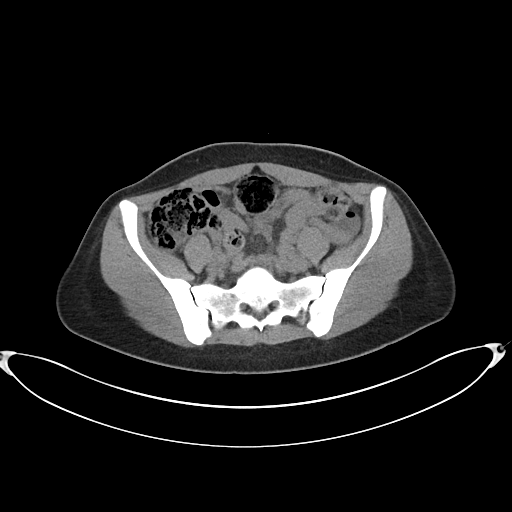
[im 37/78  soft-tissue]
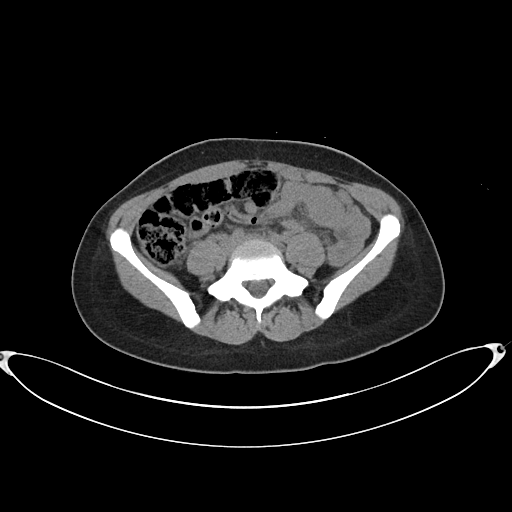
[im 41/78  soft-tissue]
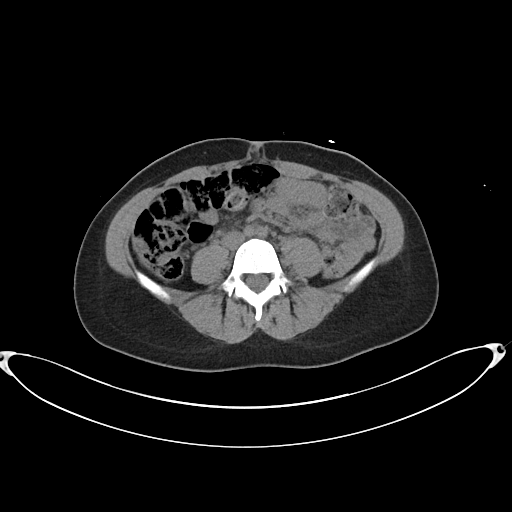
[im 47/78  soft-tissue]
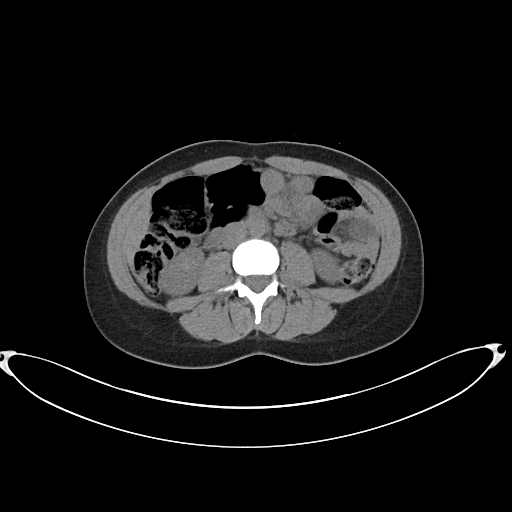
[im 47/78  bone]
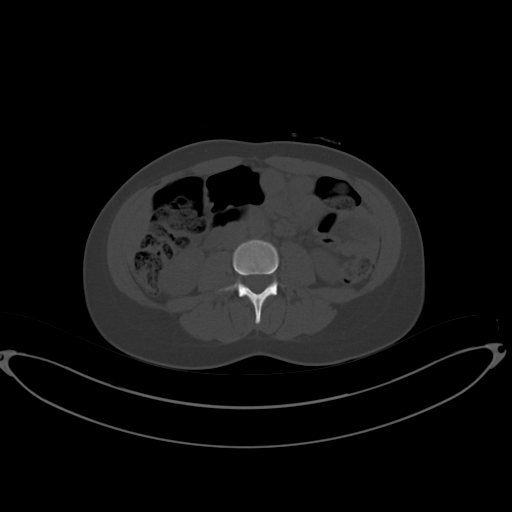
[im 53/78  soft-tissue]
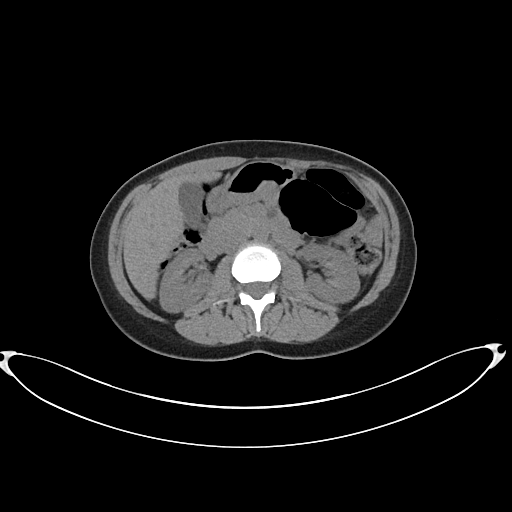
[im 59/78  soft-tissue]
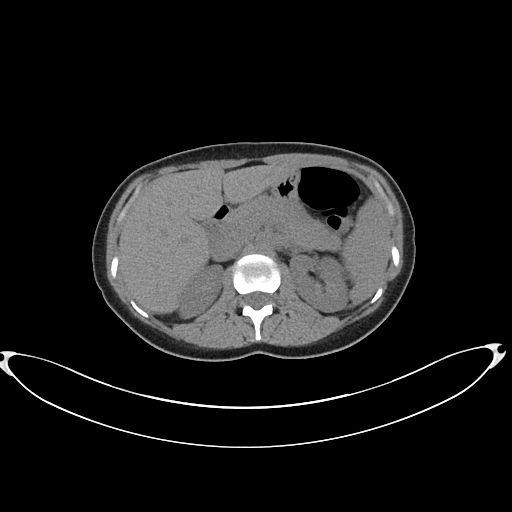
[im 62/78  soft-tissue]
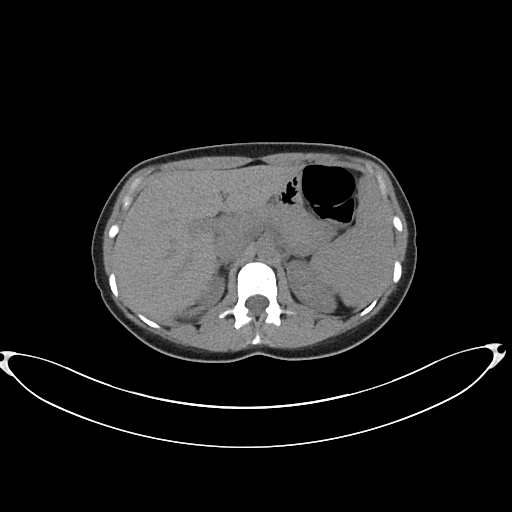
[im 68/78  soft-tissue]
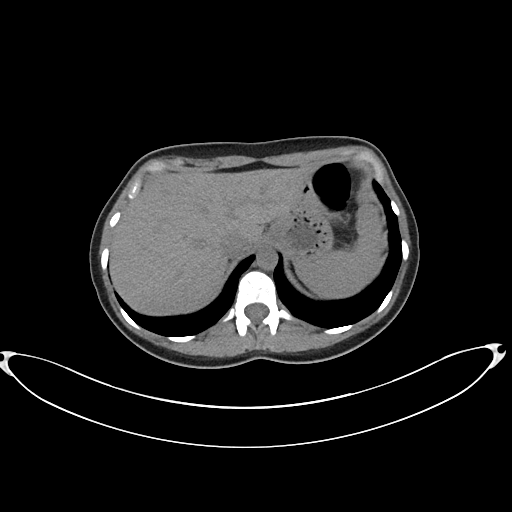
[im 74/78  soft-tissue]
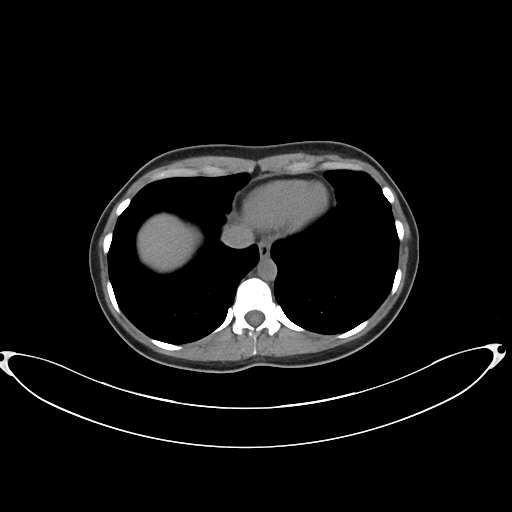

[Series 6: cor · coronal · 0.76mm/px · 3 of 106 slices shown]
[im 36/106  soft-tissue]
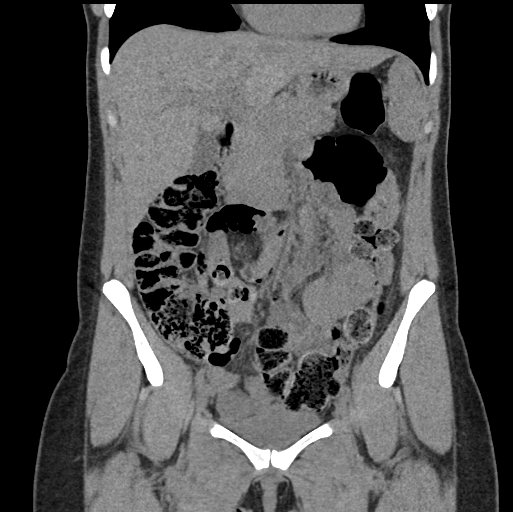
[im 47/106  soft-tissue]
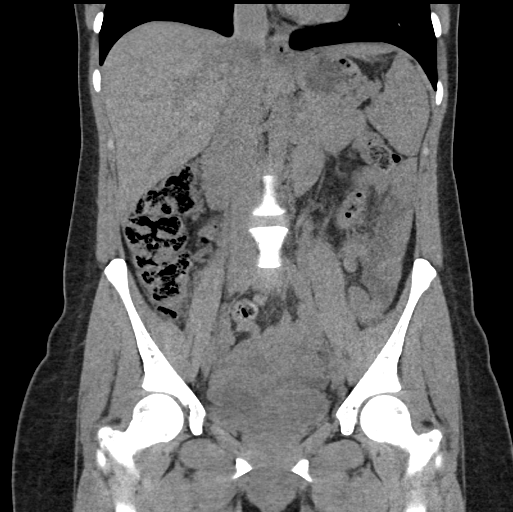
[im 59/106  soft-tissue]
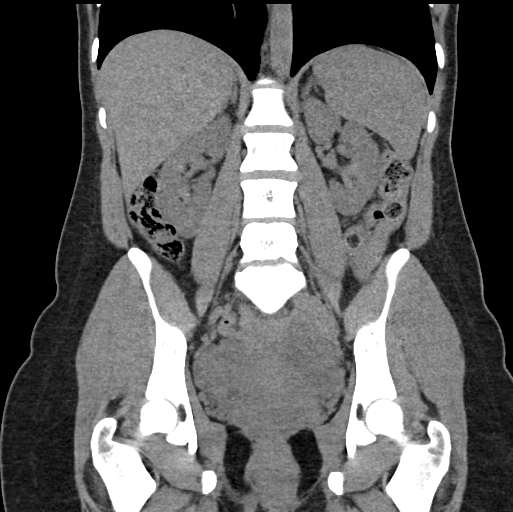

[17 of 46 positions shown; findings below may reference images not displayed]

FINDINGS: Lower chest: The lung bases are clear. The imaged heart is
unremarkable.

Hepatobiliary: The liver and gallbladder are unremarkable. There is
no biliary ductal dilatation.

Pancreas: Unremarkable.

Spleen: Unremarkable.

Adrenals/Urinary Tract: The adrenals are unremarkable.

The kidneys are normal in appearance, with no focal lesions or
calculi. There is no hydronephrosis or hydroureter. No stones are
seen along the course of either ureter. The bladder is decompressed
and not well evaluated.

Stomach/Bowel: The stomach is unremarkable. There is no evidence of
bowel obstruction. There is no abnormal bowel wall thickening or
inflammatory change.

Vascular/Lymphatic: The abdominal aorta is nonaneurysmal. There is
no abdominal or pelvic lymphadenopathy.

Reproductive: The uterus is unremarkable. The left ovary appears
enlarged measuring up to 5.2 cm x 3.3 cm.

Other: There is trace free pelvic fluid. There is no free
intraperitoneal air. There is a small fat containing umbilical
hernia.

Musculoskeletal: There is no acute osseous abnormality.
IMPRESSION: 1. Asymmetrically enlarged left ovary. Recommend pelvic ultrasound
to assess for underlying lesion or ovarian torsion.
2. No evidence of nephroureterolithiasis or hydronephrosis.

These results were called by telephone at the time of interpretation
on 10/03/2020 at [DATE] to provider CONSIDER BLINGZ , who verbally
acknowledged these results.

## 2022-03-28 NOTE — Telephone Encounter (Signed)
Scheduled appt per 2/6 referral. Pt is aware of appt date and time. Pt is aware to arrive 15 mins prior to appt time and to bring and updated insurance card. Pt is aware of appt location.

## 2022-03-30 LAB — CBC WITH DIFFERENTIAL/PLATELET
Basophils Absolute: 0 10*3/uL (ref 0.0–0.2)
Basos: 1 %
EOS (ABSOLUTE): 0.1 10*3/uL (ref 0.0–0.4)
Eos: 2 %
Hematocrit: 41.5 % (ref 34.0–46.6)
Hemoglobin: 13.7 g/dL (ref 11.1–15.9)
Immature Grans (Abs): 0 10*3/uL (ref 0.0–0.1)
Immature Granulocytes: 0 %
Lymphocytes Absolute: 2.8 10*3/uL (ref 0.7–3.1)
Lymphs: 36 %
MCH: 28.8 pg (ref 26.6–33.0)
MCHC: 33 g/dL (ref 31.5–35.7)
MCV: 87 fL (ref 79–97)
Monocytes Absolute: 0.5 10*3/uL (ref 0.1–0.9)
Monocytes: 7 %
Neutrophils Absolute: 4.3 10*3/uL (ref 1.4–7.0)
Neutrophils: 54 %
Platelets: 297 10*3/uL (ref 150–450)
RBC: 4.76 x10E6/uL (ref 3.77–5.28)
RDW: 12.1 % (ref 11.7–15.4)
WBC: 7.8 10*3/uL (ref 3.4–10.8)

## 2022-03-30 LAB — TRANSFERRIN SATURATION
IRON SATN MFR SERPL: 36 % Saturation
IRON SERPL-MCNC: 117 ug/dL
TRANSFERRIN SERPL-MCNC: 235 mg/dL

## 2022-04-03 LAB — HEPATIC FUNCTION PANEL
ALT: 6 IU/L (ref 0–32)
AST: 12 IU/L (ref 0–40)
Albumin: 3.9 g/dL (ref 3.9–4.9)
Alkaline Phosphatase: 57 IU/L (ref 44–121)
Bilirubin Total: 0.5 mg/dL (ref 0.0–1.2)
Bilirubin, Direct: 0.13 mg/dL (ref 0.00–0.40)
Total Protein: 6 g/dL (ref 6.0–8.5)

## 2022-04-03 LAB — IRON,TIBC AND FERRITIN PANEL
Ferritin: 49 ng/mL (ref 15–150)
Iron Saturation: 39 % (ref 15–55)
Iron: 91 ug/dL (ref 27–159)
Total Iron Binding Capacity: 235 ug/dL — ABNORMAL LOW (ref 250–450)
UIBC: 144 ug/dL (ref 131–425)

## 2022-04-19 ENCOUNTER — Inpatient Hospital Stay: Payer: BC Managed Care – PPO

## 2022-04-19 ENCOUNTER — Encounter: Payer: Self-pay | Admitting: Oncology

## 2022-04-19 ENCOUNTER — Inpatient Hospital Stay: Payer: BC Managed Care – PPO | Attending: Oncology | Admitting: Oncology

## 2022-04-19 VITALS — BP 111/70 | HR 93 | Temp 98.2°F | Resp 17 | Wt 126.1 lb

## 2022-04-19 DIAGNOSIS — Z8349 Family history of other endocrine, nutritional and metabolic diseases: Secondary | ICD-10-CM | POA: Diagnosis not present

## 2022-04-19 NOTE — Progress Notes (Signed)
[facility] Initial Visit:  Patient Care Team: Kerin Perna, NP as PCP - General (Internal Medicine)  CHIEF COMPLAINTS/PURPOSE OF CONSULTATION:   HISTORY OF PRESENTING ILLNESS: Brianna Hobbs 37 y.o. female is here because of family history of hemochromatosis Patient without significant PMH  March 26, 2022: WBC 7.8 hemoglobin 13.7 platelet count 297 ferritin 40 iron saturation 39% albumin 3.9 T. bili 0.5 AST 12 ALT 6  April 19 2022:  [facility] Patient states that her mother age 49 was diagnosed with hemochromatosis after being found to have abnormal imaging, likely U/S performed to evaluate for cholecystitis.  Mother underwent cholecystectomy and at the time a liver bx was performed which per patient showed fatty liver and increased iron stores. She was subsequently diagnosed with hemochromatosis.  Patient is English/Irish by heritage.   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  Tobacco none.  EtOH 2 to 3 drinks per month  Review of Systems - Oncology  MEDICAL HISTORY: History reviewed. No pertinent past medical history.  SURGICAL HISTORY: History reviewed. No pertinent surgical history.  SOCIAL HISTORY: Social History   Socioeconomic History   Marital status: Divorced    Spouse name: Not on file   Number of children: Not on file   Years of education: Not on file   Highest education level: Not on file  Occupational History   Not on file  Tobacco Use   Smoking status: Never   Smokeless tobacco: Never  Vaping Use   Vaping Use: Never used  Substance and Sexual Activity   Alcohol use: Yes   Drug use: Never   Sexual activity: Not on file  Other Topics Concern   Not on file  Social History Narrative   Not on file   Social Determinants of Health   Financial Resource Strain: Not on file  Food Insecurity: No Food Insecurity (04/19/2022)   Hunger Vital Sign    Worried About Running Out  of Food in the Last Year: Never true    Ran Out of Food in the Last Year: Never true  Transportation Needs: No Transportation Needs (04/19/2022)   PRAPARE - Hydrologist (Medical): No    Lack of Transportation (Non-Medical): No  Physical Activity: Not on file  Stress: Not on file  Social Connections: Not on file  Intimate Partner Violence: Not At Risk (04/19/2022)   Humiliation, Afraid, Rape, and Kick questionnaire    Fear of Current or Ex-Partner: No    Emotionally Abused: No    Physically Abused: No    Sexually Abused: No    FAMILY HISTORY Family History  Problem Relation Age of Onset   Healthy Mother    Healthy Father     ALLERGIES:  is allergic to bactrim [sulfamethoxazole-trimethoprim].  MEDICATIONS:  Current Outpatient Medications  Medication Sig Dispense Refill   albuterol (VENTOLIN HFA) 108 (90 Base) MCG/ACT inhaler Inhale 2 puffs into the lungs every 6 (six) hours as needed for wheezing or shortness of breath. 8 g 2   Cholecalciferol (VITAMIN D3) 50 MCG (2000 UT) capsule Take 1 capsule (2,000 Units total) by mouth daily. 90 capsule 1   folic acid (FOLVITE) 1 MG tablet Take 1 mg by mouth daily.     minocycline (MINOCIN) 50 MG capsule Take 1 capsule (50 mg total) by mouth 2 (two) times daily. 60 capsule 2   Multiple Vitamin (MULTIVITAMIN WITH MINERALS) TABS tablet Take 1 tablet by  mouth daily.     pseudoephedrine (SUDAFED 12 HOUR) 120 MG 12 hr tablet Take 1 tablet (120 mg total) by mouth 2 (two) times daily. 60 tablet 1   No current facility-administered medications for this visit.    PHYSICAL EXAMINATION:  ECOG PERFORMANCE STATUS: 0 - Asymptomatic   Vitals:   04/19/22 1507  BP: 111/70  Pulse: 93  Resp: 17  Temp: 98.2 F (36.8 C)  SpO2: 100%    Filed Weights   04/19/22 1507  Weight: 126 lb 2 oz (57.2 kg)     Physical Exam Vitals and nursing note reviewed.  Constitutional:      General: She is not in acute distress.     Appearance: Normal appearance. She is normal weight. She is not toxic-appearing or diaphoretic.     Comments: Here alone.  Looks well  HENT:     Head: Normocephalic and atraumatic.     Right Ear: External ear normal.     Left Ear: External ear normal.     Nose: Nose normal. No congestion or rhinorrhea.  Eyes:     General: No scleral icterus.    Extraocular Movements: Extraocular movements intact.     Conjunctiva/sclera: Conjunctivae normal.     Pupils: Pupils are equal, round, and reactive to light.  Cardiovascular:     Rate and Rhythm: Normal rate and regular rhythm.     Heart sounds: No murmur heard.    No friction rub. No gallop.  Pulmonary:     Effort: Pulmonary effort is normal. No respiratory distress.     Breath sounds: Normal breath sounds.  Abdominal:     General: Bowel sounds are normal.     Palpations: Abdomen is soft. There is no mass.     Tenderness: There is no abdominal tenderness. There is no guarding or rebound.  Musculoskeletal:        General: No swelling, tenderness or deformity.     Cervical back: Normal range of motion and neck supple. No rigidity or tenderness.  Lymphadenopathy:     Head:     Right side of head: No submental, submandibular, tonsillar, preauricular, posterior auricular or occipital adenopathy.     Left side of head: No submental, submandibular, tonsillar, preauricular, posterior auricular or occipital adenopathy.     Cervical: No cervical adenopathy.     Right cervical: No superficial, deep or posterior cervical adenopathy.    Left cervical: No superficial, deep or posterior cervical adenopathy.     Upper Body:     Right upper body: No supraclavicular, axillary, pectoral or epitrochlear adenopathy.     Left upper body: No supraclavicular, axillary, pectoral or epitrochlear adenopathy.  Skin:    General: Skin is warm.     Coloration: Skin is not jaundiced.  Neurological:     General: No focal deficit present.     Mental Status: She is  alert and oriented to person, place, and time.     Cranial Nerves: No cranial nerve deficit.     Motor: No weakness.  Psychiatric:        Mood and Affect: Mood normal.        Behavior: Behavior normal.        Thought Content: Thought content normal.        Judgment: Judgment normal.      LABORATORY DATA: I have personally reviewed the data as listed:  Office Visit on 03/26/2022  Component Date Value Ref Range Status   WBC 03/26/2022 7.8  3.4 -  10.8 x10E3/uL Final   RBC 03/26/2022 4.76  3.77 - 5.28 x10E6/uL Final   Hemoglobin 03/26/2022 13.7  11.1 - 15.9 g/dL Final   Hematocrit 03/26/2022 41.5  34.0 - 46.6 % Final   MCV 03/26/2022 87  79 - 97 fL Final   MCH 03/26/2022 28.8  26.6 - 33.0 pg Final   MCHC 03/26/2022 33.0  31.5 - 35.7 g/dL Final   RDW 03/26/2022 12.1  11.7 - 15.4 % Final   Platelets 03/26/2022 297  150 - 450 x10E3/uL Final   Neutrophils 03/26/2022 54  Not Estab. % Final   Lymphs 03/26/2022 36  Not Estab. % Final   Monocytes 03/26/2022 7  Not Estab. % Final   Eos 03/26/2022 2  Not Estab. % Final   Basos 03/26/2022 1  Not Estab. % Final   Neutrophils Absolute 03/26/2022 4.3  1.4 - 7.0 x10E3/uL Final   Lymphocytes Absolute 03/26/2022 2.8  0.7 - 3.1 x10E3/uL Final   Monocytes Absolute 03/26/2022 0.5  0.1 - 0.9 x10E3/uL Final   EOS (ABSOLUTE) 03/26/2022 0.1  0.0 - 0.4 x10E3/uL Final   Basophils Absolute 03/26/2022 0.0  0.0 - 0.2 x10E3/uL Final   Immature Granulocytes 03/26/2022 0  Not Estab. % Final   Immature Grans (Abs) 03/26/2022 0.0  0.0 - 0.1 x10E3/uL Final   IRON SERPL-MCNC 03/26/2022 117  ug/dL Final   Comment: Reference Range: >=10y: 37 - 145    IRON SATN MFR SERPL 03/26/2022 36  % Saturation Final   Comment: Reference Range: Children and Adults: 15 - 55    TRANSFERRIN SERPL-MCNC 03/26/2022 235  mg/dL Final   Comment: Reference Range: Children and Adults:  200 - 370    Total Protein 03/26/2022 6.0  6.0 - 8.5 g/dL Final   Albumin 03/26/2022 3.9  3.9 -  4.9 g/dL Final   Bilirubin Total 03/26/2022 0.5  0.0 - 1.2 mg/dL Final   Bilirubin, Direct 03/26/2022 0.13  0.00 - 0.40 mg/dL Final   Alkaline Phosphatase 03/26/2022 57  44 - 121 IU/L Final   AST 03/26/2022 12  0 - 40 IU/L Final   ALT 03/26/2022 6  0 - 32 IU/L Final   Total Iron Binding Capacity 03/26/2022 235 (L)  250 - 450 ug/dL Final   UIBC 03/26/2022 144  131 - 425 ug/dL Final   Iron 03/26/2022 91  27 - 159 ug/dL Final   Iron Saturation 03/26/2022 39  15 - 55 % Final   Ferritin 03/26/2022 49  15 - 150 ng/mL Final    RADIOGRAPHIC STUDIES: I have personally reviewed the radiological images as listed and agree with the findings in the report  No results found.  ASSESSMENT/PLAN  37 year old female without significant medical history whose mother was recently diagnosed with hemochromatosis.   Discussed hereditary hemochromatosis. Pathophysiology, inheritance and complications    Hereditary hemochromatosis ([place]):  Most cases are due to mutation in the HFE gene, which regulates the production of  hepcidin, a protein that regulates iron absorption and storage.    Mutations in genes that encode other iron regulatory proteins have been described less commonly (hemojuvelin, hepcidin, transferrin receptor 2, and ferroportin).  Decades of excess iron absorption without concomitant blood loss are required to result in iron overload.  The two common HFE mutations which produce genotypes that can result in hemochromatosis are C282Y and H63D.  Combinations of these genotypes are as follows:  1) Homozygous C282Y/C282Y 2) Compound heterozygous C282Y/H63D 3) Homozygous H63D/H63D    Heterozygous (C282Y/wild  type or H63D/wild type) are generally not associated with iron overload.  H63D homozygosity is associated with an elevated mean ferritin level, but in one study only 6.7% had documented iron overload thus the penetrance of the H63D mutation appeared to be low.  HH is most common in Caucasians in the  [place] and [place]. It is less common in individuals with African or Hispanic ancestry and extremely uncommon in individuals with Asian ancestry  The liver is a principle organ for iron storage/deposition in [facility]. Hepatic iron overload can lead to: Hepatomegaly; Increased hepatic transaminases; Hepatic fibrosis/cirrhosis; Hepatocellular cancer ([place]).  Liver injury may be more pronounced if there is coexisting excessive alcohol use, NAFLD, or HBV or HCV infection.   Other complications:  Endocrinopathy (DM, Hypopituitarism, hypogonadism, and hypothyroidism), Cardiomyopathy, Arthritis.  Bronze skin.  Porphyria cutanea tarda.    In hemochromatosis, men are affected around 2-3 times as often as women. The estimated ratio between men and women is 1.8:1 to 3:1. Women with hemochromatosis become symptomatic later in life than men due to the blood loss and consequent iron excretion associated with menstruation. In men, the disease usually becomes apparent in the fifth decade; however, in women, it presents in the sixth decade often   Since patient's most recent labs are normal, and she does not have other risk factors for hepatic impairment (obesity, excessive EtOH use, hepatitis, blood transfusions) we discussed the following screening options 1) Genetic testing now  2) Serial lab testing in the form of yearly ferritin, iron studies and LFT's with the plan to initiate testing for hemochromatosis if ferritin > 200 and\/or fasting iron saturation >40%  Patient favors option #2 which is reasonable.  Offered serial follow up here or by PCP with option to return here for follow up visit.  She favors yearly follow up with PCP which is reasonable.        Cancer Staging  No matching staging information was found for the patient.   No problem-specific Assessment & Plan notes found for this encounter.    No orders of the defined types were placed in this encounter.   47  minutes was spent in patient  care.  This included time spent preparing to see the patient (e.g., review of tests), obtaining and/or reviewing separately obtained history, counseling and educating the patient documenting clinical information in the electronic or other health record, independently interpreting results and communicating results to the patient as well as coordination of care.       All questions were answered. The patient knows to call the clinic with any problems, questions or concerns.  This note was electronically signed.    Barbee Cough, MD  04/19/2022 4:03 PM

## 2022-04-19 NOTE — Patient Instructions (Signed)
Hemochromatosis Hemochromatosis is a condition in which the body stores too much iron. This is also called iron storage disease or iron overload disorder. The extra iron builds up in your joints, heart, liver, pancreas, and other organs, where it can cause damage. There are two forms of this condition; they include: Hereditary hemochromatosis. Defects (mutations) on certain genes can cause symptoms to develop. With this type of the condition, the body absorbs more iron than it needs from the foods you eat. Secondary hemochromatosis. With this type of the condition, iron builds up in the body due to other reasons, such as from liver disease or blood transfusions. What are the causes? This condition may be caused by: Abnormal genes passed down from both parents (inherited). Receiving blood from a donor (blood transfusion). Problems with the way the body uses iron in the bone marrow (ineffective erythropoiesis). Having chronic liver disease, such as hepatitis or liver cancer. What increases the risk? You are more likely to develop this condition if you: Inherit certain abnormal gene mutations from both parents. Are white (Caucasian). Have severe or long-term (chronic) anemia. What are the signs or symptoms? Signs and symptoms can start at any age, but they usually start in middle age. They may include: Fatigue. Weakness. Joint pain and stiffness. Abdominal pain. Weight loss. Skin turning a gray or bronze color. Loss of interest in sex. Loss of menstrual periods, in women. Loss of body hair. Shortness of breath. As hemochromatosis gets worse, it may damage the liver, heart, or pancreas. This may lead to complications such as: Diabetes. Liver cancer. Abnormal heart rhythms. Heart failure. How is this diagnosed? This condition may be diagnosed based on: Your symptoms and medical history. A physical exam. Blood tests. Genetic testing. Removal and testing of a sample of liver tissue  (liver biopsy). How is this treated? This condition is most often treated with: Therapeutic phlebotomy. In this procedure, some of your blood is removed periodically to reduce the amount of iron in your body. Over time, your body will naturally replace the blood cells that you lose during phlebotomy. At the start of treatment, you may have a unit of blood removed once or twice a week. You will have blood tests during this time to determine when your iron levels return to normal. Once your iron levels are normal, you may only need to have a phlebotomy every few months. Lifestyle changes. This may include not drinking alcohol and limiting certain items in your diet. Medicines to remove excess iron (chelation therapy). Medicines to help treat any related conditions. Genetic counseling. If you are found to have a gene mutation, other family members may need to be tested for hereditary hemochromatosis. Follow these instructions at home: Alcohol use  If you have liver damage, do not drink alcohol. If you do not have liver damage: Do not drink alcohol if: Your health care provider tells you not to drink. You are pregnant, may be pregnant, or are planning to become pregnant. If you drink alcohol: Limit how much you have to: 0-1 drink a day for women. 0-2 drinks a day for men. Know how much alcohol is in your drink. In the [place] one drink equals one 12 oz bottle of beer (355 mL), one 5 oz glass of wine (148 mL), or one 1 oz glass of hard liquor (44 mL). General instructions Stay active. Exercise for at least 30 minutes on most days of the week. Take over-the-counter and prescription medicines only as told by your health care  provider. This includes vitamins and supplements. You may need to have frequent blood or urine testing to monitor your condition and to look for complications. Follow any instructions as directed by your health care provider regarding dietary restrictions. Do not: Take  vitamins or supplements that contain iron. Take vitamin C supplements. Vitamin C makes your body absorb more iron from foods. Eat raw shellfish or raw fish. Hemochromatosis may increase your chance of infection from these foods. Keep all follow-up visits. This is important. Contact a health care provider if: You have fatigue. You have unusual weakness. You have shortness of breath. You have joint pain. You have abdominal pain. You have weight loss. Get help right away if: You have chest pain. You have trouble breathing. These symptoms may represent a serious problem that is an emergency. Do not wait to see if the symptoms will go away. Get medical help right away. Call your local emergency services (911 in the [place] Do not drive yourself to the hospital. Summary Hemochromatosis is a condition in which your body stores too much iron. This is also called iron storage disease or iron overload disorder. The extra iron builds up in your joints, heart, liver, pancreas, and other organs, where it can cause damage. Signs and symptoms can start at any age, but they usually start in middle age. To treat this condition, you will need to have some of your blood removed periodically (therapeutic phlebotomy). Removing some of your blood also removes iron from your body. You may need to have frequent blood or urine testing to monitor your condition and to look for complications. This information is not intended to replace advice given to you by your health care provider. Make sure you discuss any questions you have with your health care provider. Document Revised: 07/31/2020 Document Reviewed: 07/31/2020 Elsevier Patient Education  [place].

## 2022-04-26 ENCOUNTER — Telehealth: Payer: BC Managed Care – PPO | Admitting: Nurse Practitioner

## 2022-04-26 ENCOUNTER — Encounter (INDEPENDENT_AMBULATORY_CARE_PROVIDER_SITE_OTHER): Payer: Self-pay | Admitting: Primary Care

## 2022-04-26 DIAGNOSIS — R519 Headache, unspecified: Secondary | ICD-10-CM

## 2022-04-26 DIAGNOSIS — Z9889 Other specified postprocedural states: Secondary | ICD-10-CM | POA: Insufficient documentation

## 2022-04-26 MED ORDER — CYCLOBENZAPRINE HCL 5 MG PO TABS: 5.0000 mg | ORAL_TABLET | Freq: Every day | ORAL | 0 refills | Status: AC

## 2022-04-26 NOTE — Progress Notes (Signed)
Virtual Visit Consent   Brianna Hobbs, you are scheduled for a virtual visit with a [facility] provider today. Just as with appointments in the office, your consent must be obtained to participate. Your consent will be active for this visit and any virtual visit you may have with one of our providers in the next 365 days. If you have a MyChart account, a copy of this consent can be sent to you electronically.  As this is a virtual visit, video technology does not allow for your provider to perform a traditional examination. This may limit your provider's ability to fully assess your condition. If your provider identifies any concerns that need to be evaluated in person or the need to arrange testing (such as labs, EKG, etc.), we will make arrangements to do so. Although advances in technology are sophisticated, we cannot ensure that it will always work on either your end or our end. If the connection with a video visit is poor, the visit may have to be switched to a telephone visit. With either a video or telephone visit, we are not always able to ensure that we have a secure connection.  By engaging in this virtual visit, you consent to the provision of healthcare and authorize for your insurance to be billed (if applicable) for the services provided during this visit. Depending on your insurance coverage, you may receive a charge related to this service.  I need to obtain your verbal consent now. Are you willing to proceed with your visit today? Brianna Hobbs has provided verbal consent on 04/26/2022 for a virtual visit (video or telephone). Brianna Schneiders, FNP  Date: 04/26/2022 12:22 PM  Virtual Visit via Video Note   I, Brianna Hobbs, connected with  Brianna Hobbs  ([id number], [birth year]) on 04/26/22 at 12:15 PM EST by a video-enabled telemedicine application and verified that I am speaking with the correct person using two identifiers.  Location: Patient: Virtual Visit Location  Patient: Home Provider: Virtual Visit Location Provider: Home Office   I discussed the limitations of evaluation and management by telemedicine and the availability of in person appointments. The patient expressed understanding and agreed to proceed.    History of Present Illness: Brianna Hobbs is a 37 y.o. who identifies as a female who was assigned female at birth, and is being seen today for headaches.  She has had a headache each day this week after work  The headache is not present when she wakes up Typically starts after work around 3-4pm  She wears contacts or glasses has had headaches with wearing both  Headache location is left posterior region  Pain feels dull   Denies visual changes  Occasional nausea   Denies a history of headaches or migraines  Denies any new supplements/medications   She typically takes Vitamin d, multivitamin and folic acid has been off for one week   Had [facility] 03/26/22 for family history of hemochromatosis   She works as a Pharmacist, hospital  Denies any moves or new pets  Denies known head trauma   She has been noting more neck "issues" lately  Stress at work is consistent  She has been sleeping the same, wakes occasionally   She has a 2018 car  Lives in a newer building   She has been laying down and trying to sleep off her headaches  They have not been debilitating  She is able to rest Residual headache is there before bed but gone before she wakes up  Headache does  not wake her up at night  She has not taken anything OTC for HA  Has a Nexplanon placed in 01/2022 Irregular cycle  Last was end of February    Problems:  Patient Active Problem List   Diagnosis Date Noted   History of loop electrosurgical excision procedure (LEEP) 04/26/2022   Family history of hemochromatosis 04/19/2022   Vitamin D deficiency 08/25/2018    Allergies:  Allergies  Allergen Reactions   Bactrim [Sulfamethoxazole-Trimethoprim] Hives   Medications:   Current Outpatient Medications:    albuterol (VENTOLIN HFA) 108 (90 Base) MCG/ACT inhaler, Inhale 2 puffs into the lungs every 6 (six) hours as needed for wheezing or shortness of breath., Disp: 8 g, Rfl: 2   Cholecalciferol (VITAMIN D3) 50 MCG (2000 UT) capsule, Take 1 capsule (2,000 Units total) by mouth daily., Disp: 90 capsule, Rfl: 1   folic acid (FOLVITE) 1 MG tablet, Take 1 mg by mouth daily., Disp: , Rfl:    minocycline (MINOCIN) 50 MG capsule, Take 1 capsule (50 mg total) by mouth 2 (two) times daily., Disp: 60 capsule, Rfl: 2   Multiple Vitamin (MULTIVITAMIN WITH MINERALS) TABS tablet, Take 1 tablet by mouth daily., Disp: , Rfl:    pseudoephedrine (SUDAFED 12 HOUR) 120 MG 12 hr tablet, Take 1 tablet (120 mg total) by mouth 2 (two) times daily., Disp: 60 tablet, Rfl: 1  Observations/Objective: Patient is well-developed, well-nourished in no acute distress.  Resting comfortably  at home.  Head is normocephalic, atraumatic.  No labored breathing.  Speech is clear and coherent with logical content.  Patient is alert and oriented at baseline.    Assessment and Plan: 1. Nonintractable episodic headache, unspecified headache type Likely tension HA Discussed heat to neck for up to 10 minutes each hour Passive ROM to neck and arm  Topical Icy hot to neck   - cyclobenzaprine (FLEXERIL) 5 MG tablet; Take 1 tablet (5 mg total) by mouth at bedtime for 5 days.  Dispense: 5 tablet; Refill: 0    Also encourages low dose advil OTC for pain and inflammation relief  If no improvement over the weekend or with new or worsening symptoms follow up as discussed    Follow Up Instructions: I discussed the assessment and treatment plan with the patient. The patient was provided an opportunity to ask questions and all were answered. The patient agreed with the plan and demonstrated an understanding of the instructions.  A copy of instructions were sent to the patient via MyChart unless otherwise noted  below.    The patient was advised to call back or seek an in-person evaluation if the symptoms worsen or if the condition fails to improve as anticipated.  Time:  I spent 15 minutes with the patient via telehealth technology discussing the above problems/concerns.    Brianna Schneiders, FNP

## 2022-04-28 ENCOUNTER — Encounter: Payer: Self-pay | Admitting: Nurse Practitioner

## 2022-05-01 ENCOUNTER — Ambulatory Visit
Admission: RE | Admit: 2022-05-01 | Discharge: 2022-05-01 | Disposition: A | Discharge status: Home / Self Care | Payer: BC Managed Care – PPO | Source: Ambulatory Visit | Attending: Internal Medicine | Admitting: Internal Medicine

## 2022-05-01 VITALS — BP 132/83 | HR 97 | Temp 98.0°F | Resp 16

## 2022-05-01 DIAGNOSIS — Z20822 Contact with and (suspected) exposure to covid-19: Secondary | ICD-10-CM

## 2022-05-01 DIAGNOSIS — J069 Acute upper respiratory infection, unspecified: Secondary | ICD-10-CM

## 2022-05-01 DIAGNOSIS — J029 Acute pharyngitis, unspecified: Secondary | ICD-10-CM | POA: Diagnosis present

## 2022-05-01 LAB — POCT RAPID STREP A (OFFICE): Rapid Strep A Screen: NEGATIVE

## 2022-05-01 LAB — SARS CORONAVIRUS 2 (TAT 6-24 HRS): SARS Coronavirus 2: NEGATIVE

## 2022-05-01 MED ORDER — PROMETHAZINE-DM 6.25-15 MG/5ML PO SYRP: 5.0000 mL | ORAL_SOLUTION | Freq: Every evening | ORAL | 0 refills | Status: AC | PRN

## 2022-05-01 MED ORDER — FLUTICASONE PROPIONATE 50 MCG/ACT NA SUSP: 1.0000 | Freq: Every day | NASAL | 0 refills | Status: AC

## 2022-05-01 MED ORDER — BENZONATATE 100 MG PO CAPS: 100.0000 mg | ORAL_CAPSULE | Freq: Three times a day (TID) | ORAL | 0 refills | Status: AC | PRN

## 2022-05-01 NOTE — Discharge Instructions (Signed)
Rapid strep was negative.  Throat culture and COVID test pending.  Suspect you have a viral illness causing your symptoms.  I have prescribed you 3 medications to alleviate symptoms.  Please be advised that Promethazine DM can make you drowsy.  Do not drive or drink alcohol with taking it.  Follow-up if any symptoms persist or worsen.

## 2022-05-01 NOTE — ED Provider Notes (Signed)
[facility]    CSN: [id number] Arrival date & time: 05/01/22  0954      History   Chief Complaint Chief Complaint  Patient presents with   Cough    HPI Brianna Hobbs is a 37 y.o. female.   Patient presents with cough, nasal congestion, sore throat that started about 2 to 3 days ago.  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  Patient reports low-grade temp of 99 at home.  She has not taken any medications to help alleviate symptoms.  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  Denies chest pain, shortness of breath, nausea, vomiting, diarrhea, abdominal pain.   Cough   History reviewed. No pertinent past medical history.  Patient Active Problem List   Diagnosis Date Noted   History of loop electrosurgical excision procedure (LEEP) 04/26/2022   Family history of hemochromatosis 04/19/2022   Vitamin D deficiency 08/25/2018    History reviewed. No pertinent surgical history.  OB History   No obstetric history on file.      Home Medications    Prior to Admission medications   Medication Sig Start Date End Date Taking? Authorizing Provider  benzonatate (TESSALON) 100 MG capsule Take 1 capsule (100 mg total) by mouth every 8 (eight) hours as needed for cough. 05/01/22  Yes Jalina Blowers, Hildred Alamin E, FNP  fluticasone (FLONASE) 50 MCG/ACT nasal spray Place 1 spray into both nostrils daily. 05/01/22  Yes Cherica Heiden, Michele Rockers, FNP  promethazine-dextromethorphan (PROMETHAZINE-DM) 6.25-15 MG/5ML syrup Take 5 mLs by mouth at bedtime as needed for cough. 05/01/22  Yes Kenzey Birkland, Michele Rockers, FNP  albuterol (VENTOLIN HFA) 108 (90 Base) MCG/ACT inhaler Inhale 2 puffs into the lungs every 6 (six) hours as needed for wheezing or shortness of breath. 01/16/21   Kerin Perna, NP  Cholecalciferol (VITAMIN D3) 50 MCG (2000 UT) capsule Take 1 capsule (2,000 Units total) by mouth daily. 05/10/20    Kerin Perna, NP  cyclobenzaprine (FLEXERIL) 5 MG tablet Take 1 tablet (5 mg total) by mouth at bedtime for 5 days. 04/26/22 05/01/22  Apolonio Schneiders, FNP  folic acid (FOLVITE) 1 MG tablet Take 1 mg by mouth daily.    [provider]  minocycline (MINOCIN) 50 MG capsule Take 1 capsule (50 mg total) by mouth 2 (two) times daily. 08/06/21   Lavonna Monarch, MD  Multiple Vitamin (MULTIVITAMIN WITH MINERALS) TABS tablet Take 1 tablet by mouth daily.    [provider]  pseudoephedrine (SUDAFED 12 HOUR) 120 MG 12 hr tablet Take 1 tablet (120 mg total) by mouth 2 (two) times daily. 01/16/21   Kerin Perna, NP    Family History Family History  Problem Relation Age of Onset   Healthy Mother    Healthy Father     Social History Social History   Tobacco Use   Smoking status: Never   Smokeless tobacco: Never  Vaping Use   Vaping Use: Never used  Substance Use Topics   Alcohol use: Yes   Drug use: Never     Allergies   Bactrim [sulfamethoxazole-trimethoprim]   Review of Systems Review of Systems Per HPI  Physical Exam Triage Vital Signs ED Triage Vitals [05/01/22 1006]  Enc Vitals Group     BP 132/83     Pulse Rate 97     Resp 16     Temp 98 F (36.7 C)  Temp Source Oral     SpO2 98 %     Weight      Height      Head Circumference      Peak Flow      Pain Score 0     Pain Loc      Pain Edu?      Excl. in [place]?    No data found.  Updated Vital Signs BP 132/83 (BP Location: Left Arm)   Pulse 97   Temp 98 F (36.7 C) (Oral)   Resp 16   SpO2 98%   Visual Acuity Right Eye Distance:   Left Eye Distance:   Bilateral Distance:    Right Eye Near:   Left Eye Near:    Bilateral Near:     Physical Exam Constitutional:      General: She is not in acute distress.    Appearance: Normal appearance. She is not toxic-appearing or diaphoretic.  HENT:     Head: Normocephalic and atraumatic.     Right Ear: Tympanic membrane and ear canal  normal.     Left Ear: Tympanic membrane and ear canal normal.     Nose: Congestion present.     Mouth/Throat:     Mouth: Mucous membranes are moist.     Pharynx: Posterior oropharyngeal erythema present.  Eyes:     Extraocular Movements: Extraocular movements intact.     Conjunctiva/sclera: Conjunctivae normal.     Pupils: Pupils are equal, round, and reactive to light.  Cardiovascular:     Rate and Rhythm: Normal rate and regular rhythm.     Pulses: Normal pulses.     Heart sounds: Normal heart sounds.  Pulmonary:     Effort: Pulmonary effort is normal. No respiratory distress.     Breath sounds: Normal breath sounds. No wheezing.  Abdominal:     General: Abdomen is flat. Bowel sounds are normal.     Palpations: Abdomen is soft.  Musculoskeletal:        General: Normal range of motion.     Cervical back: Normal range of motion.  Skin:    General: Skin is warm and dry.  Neurological:     General: No focal deficit present.     Mental Status: She is alert and oriented to person, place, and time. Mental status is at baseline.  Psychiatric:        Mood and Affect: Mood normal.        Behavior: Behavior normal.      UC Treatments / Results  Labs (all labs ordered are listed, but only abnormal results are displayed) Labs Reviewed  SARS CORONAVIRUS 2 (TAT 6-24 HRS)  CULTURE, GROUP A STREP [place])  POCT RAPID STREP A (OFFICE)    EKG   Radiology No results found.  Procedures Procedures (including critical care time)  Medications Ordered in UC Medications - No data to display  Initial Impression / Assessment and Plan / UC Course  I have reviewed the triage vital signs and the nursing notes.  Pertinent labs & imaging results that were available during my care of the patient were reviewed by me and considered in my medical decision making (see chart for details).     Patient presents with symptoms likely from a viral upper respiratory infection. Do not suspect  underlying cardiopulmonary process. Symptoms seem unlikely related to ACS, CHF or COPD exacerbations, pneumonia, pneumothorax. Patient is nontoxic appearing and not in need of emergent medical intervention.  Rapid strep is  negative.  Throat culture and COVID test pending.  Recommended symptom control with medications and supportive care.  Patient was sent prescriptions and advised that Promethazine DM can make her drowsy.  She reports that she is no longer taking Flexeril prescribed for migraine a few days prior so this should be safe.  Return if symptoms fail to improve in 1-2 weeks or you develop shortness of breath, chest pain, severe headache. Patient states understanding and is agreeable.  Discharged with PCP followup.  Final Clinical Impressions(s) / UC Diagnoses   Final diagnoses:  Encounter for laboratory testing for COVID-19 virus  Viral upper respiratory tract infection with cough  Sore throat     Discharge Instructions      Rapid strep was negative.  Throat culture and COVID test pending.  Suspect you have a viral illness causing your symptoms.  I have prescribed you 3 medications to alleviate symptoms.  Please be advised that Promethazine DM can make you drowsy.  Do not drive or drink alcohol with taking it.  Follow-up if any symptoms persist or worsen.    ED Prescriptions     Medication Sig Dispense Auth. Provider   fluticasone (FLONASE) 50 MCG/ACT nasal spray Place 1 spray into both nostrils daily. 16 g Zara Wendt, Hildred Alamin E, [place]   benzonatate (TESSALON) 100 MG capsule Take 1 capsule (100 mg total) by mouth every 8 (eight) hours as needed for cough. 21 capsule [place], [place] E, [place]   promethazine-dextromethorphan (PROMETHAZINE-DM) 6.25-15 MG/5ML syrup Take 5 mLs by mouth at bedtime as needed for cough. 118 mL Teodora Medici, [place]      PDMP not reviewed this encounter.   Teodora Medici, [place] 05/01/22 1053

## 2022-05-01 NOTE — ED Triage Notes (Signed)
Pt c/o cough, sore throat, nasal congestion, fever   Onset ~ Monday

## 2022-05-03 LAB — CULTURE, GROUP A STREP (THRC)
